# Patient Record
Sex: Female | Born: 1960 | Race: Black or African American | Hispanic: No | Marital: Single | Smoking: Never smoker
Health system: Southern US, Community
[De-identification: ages and names within clinical notes are randomized; demographics above are authoritative.]

## PROBLEM LIST (undated history)

## (undated) DIAGNOSIS — E559 Vitamin D deficiency, unspecified: Secondary | ICD-10-CM

## (undated) DIAGNOSIS — D649 Anemia, unspecified: Secondary | ICD-10-CM

## (undated) DIAGNOSIS — N946 Dysmenorrhea, unspecified: Secondary | ICD-10-CM

## (undated) DIAGNOSIS — R519 Headache, unspecified: Secondary | ICD-10-CM

## (undated) DIAGNOSIS — R51 Headache: Secondary | ICD-10-CM

## (undated) DIAGNOSIS — N83209 Unspecified ovarian cyst, unspecified side: Secondary | ICD-10-CM

## (undated) DIAGNOSIS — I10 Essential (primary) hypertension: Secondary | ICD-10-CM

## (undated) DIAGNOSIS — N841 Polyp of cervix uteri: Secondary | ICD-10-CM

## (undated) DIAGNOSIS — K589 Irritable bowel syndrome without diarrhea: Secondary | ICD-10-CM

## (undated) DIAGNOSIS — M1990 Unspecified osteoarthritis, unspecified site: Secondary | ICD-10-CM

## (undated) DIAGNOSIS — D219 Benign neoplasm of connective and other soft tissue, unspecified: Secondary | ICD-10-CM

## (undated) HISTORY — PX: COLONOSCOPY: SHX174

## (undated) HISTORY — DX: Unspecified ovarian cyst, unspecified side: N83.209

## (undated) HISTORY — DX: Vitamin D deficiency, unspecified: E55.9

## (undated) HISTORY — DX: Dysmenorrhea, unspecified: N94.6

## (undated) HISTORY — DX: Anemia, unspecified: D64.9

## (undated) HISTORY — DX: Benign neoplasm of connective and other soft tissue, unspecified: D21.9

## (undated) HISTORY — PX: BREAST BIOPSY: SHX20

## (undated) HISTORY — PX: BREAST EXCISIONAL BIOPSY: SUR124

## (undated) HISTORY — DX: Polyp of cervix uteri: N84.1

---

## 1997-12-14 ENCOUNTER — Ambulatory Visit (HOSPITAL_COMMUNITY): Admission: RE | Admit: 1997-12-14 | Discharge: 1997-12-14 | Payer: Self-pay | Admitting: Dermatology

## 1998-01-14 ENCOUNTER — Ambulatory Visit (HOSPITAL_COMMUNITY): Admission: RE | Admit: 1998-01-14 | Discharge: 1998-01-14 | Payer: Self-pay | Admitting: Obstetrics and Gynecology

## 1999-06-17 ENCOUNTER — Encounter: Admission: RE | Admit: 1999-06-17 | Discharge: 1999-06-17 | Payer: Self-pay | Admitting: Obstetrics & Gynecology

## 1999-06-17 ENCOUNTER — Encounter: Payer: Self-pay | Admitting: Obstetrics & Gynecology

## 1999-07-07 ENCOUNTER — Other Ambulatory Visit: Admission: RE | Admit: 1999-07-07 | Discharge: 1999-07-07 | Payer: Self-pay | Admitting: Obstetrics and Gynecology

## 2000-06-27 ENCOUNTER — Encounter: Admission: RE | Admit: 2000-06-27 | Discharge: 2000-06-27 | Payer: Self-pay | Admitting: Internal Medicine

## 2000-06-27 ENCOUNTER — Encounter: Payer: Self-pay | Admitting: Internal Medicine

## 2000-07-05 ENCOUNTER — Encounter: Payer: Self-pay | Admitting: Internal Medicine

## 2000-07-05 ENCOUNTER — Encounter: Admission: RE | Admit: 2000-07-05 | Discharge: 2000-07-05 | Payer: Self-pay | Admitting: Internal Medicine

## 2000-09-19 ENCOUNTER — Other Ambulatory Visit: Admission: RE | Admit: 2000-09-19 | Discharge: 2000-09-19 | Payer: Self-pay | Admitting: Obstetrics and Gynecology

## 2001-07-10 ENCOUNTER — Encounter: Admission: RE | Admit: 2001-07-10 | Discharge: 2001-07-10 | Payer: Self-pay | Admitting: Internal Medicine

## 2001-07-10 ENCOUNTER — Encounter: Payer: Self-pay | Admitting: Internal Medicine

## 2001-07-23 ENCOUNTER — Encounter: Payer: Self-pay | Admitting: Internal Medicine

## 2001-07-23 ENCOUNTER — Encounter: Admission: RE | Admit: 2001-07-23 | Discharge: 2001-07-23 | Payer: Self-pay | Admitting: Internal Medicine

## 2001-09-19 ENCOUNTER — Other Ambulatory Visit: Admission: RE | Admit: 2001-09-19 | Discharge: 2001-09-19 | Payer: Self-pay | Admitting: Obstetrics and Gynecology

## 2002-09-05 ENCOUNTER — Encounter: Admission: RE | Admit: 2002-09-05 | Discharge: 2002-09-05 | Payer: Self-pay | Admitting: Internal Medicine

## 2002-09-05 ENCOUNTER — Encounter: Payer: Self-pay | Admitting: Internal Medicine

## 2002-09-23 ENCOUNTER — Other Ambulatory Visit: Admission: RE | Admit: 2002-09-23 | Discharge: 2002-09-23 | Payer: Self-pay | Admitting: Obstetrics and Gynecology

## 2003-09-16 ENCOUNTER — Other Ambulatory Visit: Admission: RE | Admit: 2003-09-16 | Discharge: 2003-09-16 | Payer: Self-pay | Admitting: Obstetrics and Gynecology

## 2003-09-29 ENCOUNTER — Ambulatory Visit (HOSPITAL_COMMUNITY): Admission: RE | Admit: 2003-09-29 | Discharge: 2003-09-29 | Payer: Self-pay | Admitting: Obstetrics and Gynecology

## 2004-09-21 ENCOUNTER — Other Ambulatory Visit: Admission: RE | Admit: 2004-09-21 | Discharge: 2004-09-21 | Payer: Self-pay | Admitting: Obstetrics and Gynecology

## 2004-10-05 ENCOUNTER — Encounter: Admission: RE | Admit: 2004-10-05 | Discharge: 2004-10-05 | Payer: Self-pay | Admitting: Internal Medicine

## 2005-09-27 ENCOUNTER — Other Ambulatory Visit: Admission: RE | Admit: 2005-09-27 | Discharge: 2005-09-27 | Payer: Self-pay | Admitting: Obstetrics and Gynecology

## 2005-10-17 ENCOUNTER — Encounter: Admission: RE | Admit: 2005-10-17 | Discharge: 2005-10-17 | Payer: Self-pay | Admitting: Obstetrics and Gynecology

## 2005-10-24 ENCOUNTER — Encounter: Admission: RE | Admit: 2005-10-24 | Discharge: 2005-10-24 | Payer: Self-pay | Admitting: Obstetrics and Gynecology

## 2006-04-06 ENCOUNTER — Encounter: Admission: RE | Admit: 2006-04-06 | Discharge: 2006-04-06 | Payer: Self-pay | Admitting: Obstetrics and Gynecology

## 2006-10-19 ENCOUNTER — Encounter: Admission: RE | Admit: 2006-10-19 | Discharge: 2006-10-19 | Payer: Self-pay | Admitting: Obstetrics and Gynecology

## 2007-11-12 ENCOUNTER — Encounter: Admission: RE | Admit: 2007-11-12 | Discharge: 2007-11-12 | Payer: Self-pay | Admitting: Obstetrics and Gynecology

## 2008-12-10 ENCOUNTER — Encounter: Admission: RE | Admit: 2008-12-10 | Discharge: 2008-12-10 | Payer: Self-pay | Admitting: Obstetrics and Gynecology

## 2009-05-11 ENCOUNTER — Encounter: Admission: RE | Admit: 2009-05-11 | Discharge: 2009-05-11 | Payer: Self-pay | Admitting: Internal Medicine

## 2009-12-13 ENCOUNTER — Encounter: Admission: RE | Admit: 2009-12-13 | Discharge: 2009-12-13 | Payer: Self-pay | Admitting: Obstetrics and Gynecology

## 2010-04-25 ENCOUNTER — Encounter: Payer: Self-pay | Admitting: Obstetrics and Gynecology

## 2010-11-15 ENCOUNTER — Other Ambulatory Visit: Payer: Self-pay | Admitting: Obstetrics and Gynecology

## 2010-11-15 DIAGNOSIS — Z1231 Encounter for screening mammogram for malignant neoplasm of breast: Secondary | ICD-10-CM

## 2010-12-16 ENCOUNTER — Ambulatory Visit
Admission: RE | Admit: 2010-12-16 | Discharge: 2010-12-16 | Disposition: A | Discharge status: Home / Self Care | Payer: 59 | Source: Ambulatory Visit | Attending: Obstetrics and Gynecology | Admitting: Obstetrics and Gynecology

## 2010-12-16 DIAGNOSIS — Z1231 Encounter for screening mammogram for malignant neoplasm of breast: Secondary | ICD-10-CM

## 2011-07-21 ENCOUNTER — Telehealth: Payer: Self-pay

## 2011-07-21 NOTE — Telephone Encounter (Signed)
PC TO PT PER MESSAGE TO HAVE SOMEONE CALL HER. LM ON VM ORIGINALLY 07/14/11. PT STATES,"HAD SPOTTING X APPROX 2WEEKS AGO;LASTING X 1 WEEK. SPOTTING STOPPED ON 07/15/2011. PT TAKING PROVERA 30MG  DAILY.  TOLD PT TO CB IF BLDG REOCCURS. PT AGREES.

## 2011-11-13 ENCOUNTER — Other Ambulatory Visit: Payer: Self-pay | Admitting: Obstetrics and Gynecology

## 2011-11-13 DIAGNOSIS — Z1231 Encounter for screening mammogram for malignant neoplasm of breast: Secondary | ICD-10-CM

## 2011-12-18 ENCOUNTER — Ambulatory Visit: Payer: 59

## 2011-12-21 ENCOUNTER — Ambulatory Visit
Admission: RE | Admit: 2011-12-21 | Discharge: 2011-12-21 | Disposition: A | Discharge status: Home / Self Care | Payer: 59 | Source: Ambulatory Visit | Attending: Obstetrics and Gynecology | Admitting: Obstetrics and Gynecology

## 2011-12-21 DIAGNOSIS — Z1231 Encounter for screening mammogram for malignant neoplasm of breast: Secondary | ICD-10-CM

## 2011-12-27 ENCOUNTER — Encounter: Payer: Self-pay | Admitting: Obstetrics and Gynecology

## 2012-01-10 ENCOUNTER — Telehealth: Payer: Self-pay

## 2012-01-10 NOTE — Telephone Encounter (Signed)
Walk in pt on 01/09/12 per Dineta R. Pt wanted vph to know she is now taking Provera 40mg  vs Provera 30mg  due to bldg reoccurring x 2 days ago. Informed pt to cb if bldg last after 7 days of being on Provera 40mg . Pt voices understanding. AEX sched 01/18/12 with vph.

## 2012-01-18 ENCOUNTER — Ambulatory Visit (INDEPENDENT_AMBULATORY_CARE_PROVIDER_SITE_OTHER): Payer: 59 | Admitting: Obstetrics and Gynecology

## 2012-01-18 ENCOUNTER — Encounter: Payer: Self-pay | Admitting: Obstetrics and Gynecology

## 2012-01-18 VITALS — BP 122/62 | Ht 59.0 in | Wt 101.0 lb

## 2012-01-18 DIAGNOSIS — D219 Benign neoplasm of connective and other soft tissue, unspecified: Secondary | ICD-10-CM

## 2012-01-18 DIAGNOSIS — N924 Excessive bleeding in the premenopausal period: Secondary | ICD-10-CM

## 2012-01-18 DIAGNOSIS — Z124 Encounter for screening for malignant neoplasm of cervix: Secondary | ICD-10-CM

## 2012-01-18 DIAGNOSIS — D259 Leiomyoma of uterus, unspecified: Secondary | ICD-10-CM

## 2012-01-18 MED ORDER — MEDROXYPROGESTERONE ACETATE 10 MG PO TABS: 10.0000 mg | ORAL_TABLET | Freq: Three times a day (TID) | ORAL | Status: DC

## 2012-01-18 NOTE — Progress Notes (Signed)
ANNUAL:  Last Pap: 12/26/2010 WNL: Yes  No hx of abnl paps Regular Periods:no Contraception: Abstinence Monthly Breast exam:no Tetanus<62yrs:yes Nl.Bladder Function:yes Daily BMs:yes Healthy Diet:yes Calcium:yes Mammogram:yes Date of Mammogram: 12/27/2011 Exercise:yes Have often Exercise: 3 times weekly Seatbelt: yes Abuse at home: no Stressful work:yes Sigmoid-colonoscopy: 12/2011 Bone Density: Yes had one many years ago at a health fair PCP: Dr. Renae Gloss Change in PMH: None Change in [facility]: None  Subjective:    Rebekah Jones is a 51 y.o. female G0P0000 who presents for annual exam.  She is taking Provera for control of perimenopausal bleeding pattern.  Denies menopausal sx.   The following portions of the patient's history were reviewed and updated as appropriate: allergies, current medications, past family history, past medical history, past social history, past surgical history and problem list.  Review of Systems Pertinent items are noted in HPI. Gastrointestinal:No change in bowel habits, no abdominal pain, no rectal bleeding Genitourinary:negative for dysuria, frequency, hematuria, nocturia and urinary incontinence    Objective:     Ht 4\' 11"  (1.499 m)  Wt 101 lb (45.813 kg)  BMI 20.40 kg/m2  Weight:  Wt Readings from Last 1 Encounters:  01/18/12 101 lb (45.813 kg)     BMI: Body mass index is 20.40 kg/(m^2). General Appearance: Alert, appropriate appearance for age. No acute distress HEENT: Grossly normal Neck / Thyroid: Supple, no masses, nodes or enlargement Lungs: clear to auscultation bilaterally Back: No CVA tenderness Breast Exam: No masses or nodes.No dimpling, nipple retraction or discharge. Cardiovascular: Regular rate and rhythm. S1, S2, no murmur Gastrointestinal: Soft, non-tender, no masses or organomegaly Pelvic Exam: External genitalia: normal general appearance Vaginal: atrophic mucosa Cervix: assymetrial.  No lesions Adnexa: non  palpable Uterus: irregular enlargement and 14-16 weeks size Exam limited by anxiety Rectovaginal: normal rectal, no masses Lymphatic Exam: Non-palpable nodes in neck, clavicular, axillary, or inguinal regions Skin: no rash or abnormalities Neurologic: Normal gait and speech, no tremor  Psychiatric: Alert and oriented, appropriate affect.    Urinalysis:Not done    Assessment:  Asymptomatic fibroids  Perimenopausal bleeding pattern with nl endo bx 1 yr ago  Improved bleeding pattern on  Provera 30 mg daily Plan:  Continue Provera 30 mg daily mammogram pap smear with HR HPV Follow-up:  in 6 month(s)   Dierdre Forth MD

## 2012-01-23 LAB — PAP IG AND HPV HIGH-RISK: HPV DNA High Risk: NOT DETECTED

## 2012-11-13 ENCOUNTER — Other Ambulatory Visit: Payer: Self-pay

## 2012-11-13 DIAGNOSIS — Z1231 Encounter for screening mammogram for malignant neoplasm of breast: Secondary | ICD-10-CM

## 2012-11-15 ENCOUNTER — Other Ambulatory Visit: Payer: Self-pay | Admitting: Obstetrics and Gynecology

## 2012-11-15 DIAGNOSIS — N950 Postmenopausal bleeding: Secondary | ICD-10-CM

## 2012-11-25 ENCOUNTER — Ambulatory Visit (HOSPITAL_COMMUNITY): Admission: RE | Admit: 2012-11-25 | Payer: 59 | Source: Ambulatory Visit

## 2012-11-25 ENCOUNTER — Other Ambulatory Visit: Payer: 59

## 2012-12-23 ENCOUNTER — Ambulatory Visit
Admission: RE | Admit: 2012-12-23 | Discharge: 2012-12-23 | Disposition: A | Discharge status: Home / Self Care | Payer: 59 | Source: Ambulatory Visit

## 2012-12-23 DIAGNOSIS — Z1231 Encounter for screening mammogram for malignant neoplasm of breast: Secondary | ICD-10-CM

## 2013-01-22 ENCOUNTER — Other Ambulatory Visit: Payer: Self-pay | Admitting: Obstetrics and Gynecology

## 2013-01-22 DIAGNOSIS — N924 Excessive bleeding in the premenopausal period: Secondary | ICD-10-CM

## 2013-01-28 ENCOUNTER — Ambulatory Visit (HOSPITAL_COMMUNITY)
Admission: RE | Admit: 2013-01-28 | Discharge: 2013-01-28 | Disposition: A | Discharge status: Home / Self Care | Payer: 59 | Source: Ambulatory Visit | Attending: Obstetrics and Gynecology | Admitting: Obstetrics and Gynecology

## 2013-01-28 ENCOUNTER — Other Ambulatory Visit: Payer: Self-pay | Admitting: Obstetrics and Gynecology

## 2013-01-28 DIAGNOSIS — D259 Leiomyoma of uterus, unspecified: Secondary | ICD-10-CM | POA: Insufficient documentation

## 2013-01-28 DIAGNOSIS — N924 Excessive bleeding in the premenopausal period: Secondary | ICD-10-CM

## 2013-01-28 MED ORDER — GADOBENATE DIMEGLUMINE 529 MG/ML IV SOLN
8.0000 mL | Freq: Once | INTRAVENOUS | Status: AC | PRN
  Administered 2013-01-28: 8 mL via INTRAVENOUS

## 2013-03-21 ENCOUNTER — Other Ambulatory Visit: Payer: Self-pay | Admitting: Obstetrics and Gynecology

## 2013-04-01 ENCOUNTER — Encounter (HOSPITAL_COMMUNITY): Payer: Self-pay | Admitting: Pharmacist

## 2013-04-10 ENCOUNTER — Other Ambulatory Visit: Payer: Self-pay

## 2013-04-10 ENCOUNTER — Encounter (INDEPENDENT_AMBULATORY_CARE_PROVIDER_SITE_OTHER): Payer: Self-pay

## 2013-04-10 ENCOUNTER — Encounter (HOSPITAL_COMMUNITY): Payer: Self-pay

## 2013-04-10 ENCOUNTER — Encounter (HOSPITAL_COMMUNITY)
Admission: RE | Admit: 2013-04-10 | Discharge: 2013-04-10 | Disposition: A | Discharge status: Home / Self Care | Payer: Managed Care, Other (non HMO) | Source: Ambulatory Visit | Attending: Obstetrics and Gynecology | Admitting: Obstetrics and Gynecology

## 2013-04-10 HISTORY — DX: Essential (primary) hypertension: I10

## 2013-04-10 LAB — CBC
HEMATOCRIT: 39.2 % (ref 36.0–46.0)
HEMOGLOBIN: 13.1 g/dL (ref 12.0–15.0)
MCH: 29 pg (ref 26.0–34.0)
MCHC: 33.4 g/dL (ref 30.0–36.0)
MCV: 86.7 fL (ref 78.0–100.0)
Platelets: 200 10*3/uL (ref 150–400)
RBC: 4.52 MIL/uL (ref 3.87–5.11)
RDW: 12.4 % (ref 11.5–15.5)
WBC: 7.1 10*3/uL (ref 4.0–10.5)

## 2013-04-10 LAB — BASIC METABOLIC PANEL
BUN: 13 mg/dL (ref 6–23)
CHLORIDE: 99 meq/L (ref 96–112)
CO2: 27 meq/L (ref 19–32)
CREATININE: 0.73 mg/dL (ref 0.50–1.10)
Calcium: 9.9 mg/dL (ref 8.4–10.5)
GFR calc Af Amer: >90 mL/min (ref >90)
GFR calc non Af Amer: >90 mL/min (ref >90)
GLUCOSE: 82 mg/dL (ref 70–99)
Potassium: 3.5 mEq/L — ABNORMAL LOW (ref 3.7–5.3)
Sodium: 136 mEq/L — ABNORMAL LOW (ref 137–147)

## 2013-04-10 NOTE — Patient Instructions (Signed)
[place]  04/10/2013   Your procedure is scheduled on:  04/11/13  Enter through the Main Entrance of [facility] up the phone at the desk and dial [phone].   Call this number if you have problems the morning of surgery: [phone]   Remember:   Do not eat food:After Midnight.  Do not drink clear liquids: After Midnight.  Take these medicines the morning of surgery with A SIP OF WATER: Maxide   Do not wear jewelry, make-up or nail polish.  Do not wear lotions, powders, or perfumes. You may wear deodorant.  Do not shave 48 hours prior to surgery.  Do not bring valuables to the hospital.  [facility] is not   responsible for any belongings or valuables brought to the hospital.  Contacts, dentures or bridgework may not be worn into surgery.  Leave suitcase in the car. After surgery it may be brought to your room.  For patients admitted to the hospital, checkout time is 11:00 AM the day of              discharge.   Patients discharged the day of surgery will not be allowed to drive             home.  Name and phone number of your driver: Education administrator  Special Instructions:   Shower using CHG 2 nights before surgery and the night before surgery.  If you shower the day of surgery use CHG.  Use special wash - you have one bottle of CHG for all showers.  You should use approximately 1/3 of the bottle for each shower.   Please read over the following fact sheets that you were given:   Surgical Site Infection Prevention

## 2013-04-11 ENCOUNTER — Encounter (HOSPITAL_COMMUNITY)
Admission: RE | Disposition: A | Discharge status: Home / Self Care | Payer: Self-pay | Source: Ambulatory Visit | Attending: Obstetrics and Gynecology

## 2013-04-11 ENCOUNTER — Encounter (HOSPITAL_COMMUNITY): Payer: Managed Care, Other (non HMO) | Admitting: Anesthesiology

## 2013-04-11 ENCOUNTER — Ambulatory Visit (HOSPITAL_COMMUNITY)
Admission: RE | Admit: 2013-04-11 | Discharge: 2013-04-11 | Disposition: A | Discharge status: Home / Self Care | Payer: Managed Care, Other (non HMO) | Source: Ambulatory Visit | Attending: Obstetrics and Gynecology | Admitting: Obstetrics and Gynecology

## 2013-04-11 ENCOUNTER — Encounter (HOSPITAL_COMMUNITY): Payer: Self-pay | Admitting: Obstetrics and Gynecology

## 2013-04-11 ENCOUNTER — Ambulatory Visit (HOSPITAL_COMMUNITY): Payer: Managed Care, Other (non HMO) | Admitting: Anesthesiology

## 2013-04-11 DIAGNOSIS — N950 Postmenopausal bleeding: Secondary | ICD-10-CM | POA: Diagnosis present

## 2013-04-11 DIAGNOSIS — D251 Intramural leiomyoma of uterus: Secondary | ICD-10-CM | POA: Insufficient documentation

## 2013-04-11 HISTORY — PX: DILATATION & CURETTAGE/HYSTEROSCOPY WITH TRUECLEAR: SHX6353

## 2013-04-11 LAB — PREGNANCY, URINE: Preg Test, Ur: NEGATIVE

## 2013-04-11 SURGERY — DILATATION & CURETTAGE/HYSTEROSCOPY WITH TRUCLEAR
Anesthesia: General | Site: Vagina

## 2013-04-11 MED ORDER — FENTANYL CITRATE 0.05 MG/ML IJ SOLN
INTRAMUSCULAR | Status: AC
  Filled 2013-04-11: qty 2

## 2013-04-11 MED ORDER — KETOROLAC TROMETHAMINE 30 MG/ML IJ SOLN
INTRAMUSCULAR | Status: DC | PRN
  Administered 2013-04-11: 15 mg via INTRAVENOUS

## 2013-04-11 MED ORDER — ONDANSETRON HCL 4 MG/2ML IJ SOLN
INTRAMUSCULAR | Status: AC
  Filled 2013-04-11: qty 2

## 2013-04-11 MED ORDER — FENTANYL CITRATE 0.05 MG/ML IJ SOLN
25.0000 ug | INTRAMUSCULAR | Status: DC | PRN
Start: 2013-04-11 — End: 2013-04-11

## 2013-04-11 MED ORDER — PROMETHAZINE HCL 25 MG/ML IJ SOLN
6.2500 mg | INTRAMUSCULAR | Status: DC | PRN
  Administered 2013-04-11: 12.5 mg via INTRAVENOUS

## 2013-04-11 MED ORDER — DEXAMETHASONE SODIUM PHOSPHATE 10 MG/ML IJ SOLN
INTRAMUSCULAR | Status: DC | PRN
  Administered 2013-04-11: 10 mg via INTRAVENOUS

## 2013-04-11 MED ORDER — PROPOFOL 10 MG/ML IV BOLUS
INTRAVENOUS | Status: DC | PRN
  Administered 2013-04-11: 100 mg via INTRAVENOUS

## 2013-04-11 MED ORDER — MIDAZOLAM HCL 2 MG/2ML IJ SOLN
INTRAMUSCULAR | Status: AC
  Filled 2013-04-11: qty 2

## 2013-04-11 MED ORDER — SODIUM CHLORIDE 0.9 % IJ SOLN
INTRAMUSCULAR | Status: AC
  Filled 2013-04-11: qty 50

## 2013-04-11 MED ORDER — ONDANSETRON HCL 4 MG/2ML IJ SOLN
4.0000 mg | Freq: Once | INTRAMUSCULAR | Status: AC | PRN
  Administered 2013-04-11: 4 mg via INTRAVENOUS

## 2013-04-11 MED ORDER — PROPOFOL 10 MG/ML IV EMUL
INTRAVENOUS | Status: AC
  Filled 2013-04-11: qty 20

## 2013-04-11 MED ORDER — VASOPRESSIN 20 UNIT/ML IJ SOLN
INTRAMUSCULAR | Status: AC
  Filled 2013-04-11: qty 1

## 2013-04-11 MED ORDER — ONDANSETRON HCL 4 MG/2ML IJ SOLN
INTRAMUSCULAR | Status: DC | PRN
  Administered 2013-04-11: 4 mg via INTRAVENOUS

## 2013-04-11 MED ORDER — KETOROLAC TROMETHAMINE 60 MG/2ML IM SOLN
INTRAMUSCULAR | Status: DC | PRN
  Administered 2013-04-11: 15 mg via INTRAMUSCULAR

## 2013-04-11 MED ORDER — LIDOCAINE HCL 2 % IJ SOLN
INTRAMUSCULAR | Status: DC | PRN
  Administered 2013-04-11: 10 mL

## 2013-04-11 MED ORDER — FENTANYL CITRATE 0.05 MG/ML IJ SOLN
INTRAMUSCULAR | Status: DC | PRN
  Administered 2013-04-11 (×4): 50 ug via INTRAVENOUS

## 2013-04-11 MED ORDER — EPHEDRINE 5 MG/ML INJ
INTRAVENOUS | Status: AC
  Filled 2013-04-11: qty 10

## 2013-04-11 MED ORDER — SODIUM CHLORIDE 0.9 % IR SOLN
Status: DC | PRN
  Administered 2013-04-11: 3000 mL

## 2013-04-11 MED ORDER — ONDANSETRON HCL 4 MG/2ML IJ SOLN
INTRAMUSCULAR | Status: AC
  Administered 2013-04-11: 16:00:00 4 mg via INTRAVENOUS
  Filled 2013-04-11: qty 2

## 2013-04-11 MED ORDER — MIDAZOLAM HCL 5 MG/5ML IJ SOLN
INTRAMUSCULAR | Status: DC | PRN
  Administered 2013-04-11: 2 mg via INTRAVENOUS

## 2013-04-11 MED ORDER — KETOROLAC TROMETHAMINE 30 MG/ML IJ SOLN: 15.0000 mg | Freq: Once | INTRAMUSCULAR | Status: DC | PRN

## 2013-04-11 MED ORDER — GENTAMICIN SULFATE 40 MG/ML IJ SOLN
Freq: Once | INTRAMUSCULAR | Status: AC
  Administered 2013-04-11: 100 mL via INTRAVENOUS
  Filled 2013-04-11: qty 1.5

## 2013-04-11 MED ORDER — EPHEDRINE SULFATE 50 MG/ML IJ SOLN
INTRAMUSCULAR | Status: DC | PRN
  Administered 2013-04-11 (×2): 10 mg via INTRAVENOUS
  Administered 2013-04-11 (×2): 5 mg via INTRAVENOUS

## 2013-04-11 MED ORDER — DEXAMETHASONE SODIUM PHOSPHATE 10 MG/ML IJ SOLN
INTRAMUSCULAR | Status: AC
  Filled 2013-04-11: qty 1

## 2013-04-11 MED ORDER — LIDOCAINE HCL (CARDIAC) 20 MG/ML IV SOLN
INTRAVENOUS | Status: DC | PRN
  Administered 2013-04-11: 40 mg via INTRAVENOUS

## 2013-04-11 MED ORDER — SODIUM CHLORIDE 0.9 % IJ SOLN
INTRAMUSCULAR | Status: AC
  Filled 2013-04-11: qty 20

## 2013-04-11 MED ORDER — KETOROLAC TROMETHAMINE 30 MG/ML IJ SOLN
INTRAMUSCULAR | Status: AC
  Filled 2013-04-11: qty 1

## 2013-04-11 MED ORDER — LIDOCAINE HCL (CARDIAC) 20 MG/ML IV SOLN
INTRAVENOUS | Status: AC
  Filled 2013-04-11: qty 5

## 2013-04-11 MED ORDER — LIDOCAINE HCL 2 % IJ SOLN
INTRAMUSCULAR | Status: AC
  Filled 2013-04-11: qty 20

## 2013-04-11 MED ORDER — PROMETHAZINE HCL 25 MG/ML IJ SOLN
INTRAMUSCULAR | Status: AC
  Administered 2013-04-11: 16:00:00 12.5 mg via INTRAVENOUS
  Filled 2013-04-11: qty 1

## 2013-04-11 MED ORDER — MEPERIDINE HCL 25 MG/ML IJ SOLN: 6.2500 mg | INTRAMUSCULAR | Status: DC | PRN

## 2013-04-11 MED ORDER — LACTATED RINGERS IV SOLN
INTRAVENOUS | Status: DC
  Administered 2013-04-11 (×2): via INTRAVENOUS

## 2013-04-11 SURGICAL SUPPLY — 28 items
BLADE INCISOR TRUC PLUS 2.9 (ABLATOR) IMPLANT
BOOTIES KNEE HIGH SLOAN (MISCELLANEOUS) IMPLANT
CANISTERS HI-FLOW 3000CC (CANNISTER) ×3 IMPLANT
CATH ROBINSON RED A/P 16FR (CATHETERS) IMPLANT
CLOTH BEACON ORANGE TIMEOUT ST (SAFETY) ×3 IMPLANT
CONTAINER PREFILL 10% NBF 60ML (FORM) ×3 IMPLANT
CORD ACTIVE DISPOSABLE (ELECTRODE) ×2
CORD ELECTRO ACTIVE DISP (ELECTRODE) ×1 IMPLANT
DILATOR CANAL MILEX (MISCELLANEOUS) IMPLANT
DRAPE HYSTEROSCOPY (DRAPE) ×3 IMPLANT
DRSG TELFA 3X8 NADH (GAUZE/BANDAGES/DRESSINGS) ×3 IMPLANT
ELECT REM PT RETURN 9FT ADLT (ELECTROSURGICAL) ×3
ELECTRODE REM PT RTRN 9FT ADLT (ELECTROSURGICAL) ×1 IMPLANT
GLOVE SURG SS PI 6.5 STRL IVOR (GLOVE) ×6 IMPLANT
GLYCINE 1.5% IRRIG UROMATIC (IV SOLUTION) ×6 IMPLANT
GOWN STRL REIN XL XLG (GOWN DISPOSABLE) ×6 IMPLANT
INCISOR TRUC PLUS BLADE 2.9 (ABLATOR)
IV NS IRRIG 3000ML ARTHROMATIC (IV SOLUTION) ×12 IMPLANT
KIT HYSTEROSCOPY TRUCLEAR (ABLATOR) ×3 IMPLANT
LOOP ANGLED CUTTING 22FR (CUTTING LOOP) ×3 IMPLANT
MORCELLATOR RECIP TRUCLEAR 4.0 (ABLATOR) ×3 IMPLANT
NEEDLE SPNL 22GX3.5 QUINCKE BK (NEEDLE) ×3 IMPLANT
PACK VAGINAL MINOR WOMEN LF (CUSTOM PROCEDURE TRAY) ×3 IMPLANT
PAD OB MATERNITY 4.3X12.25 (PERSONAL CARE ITEMS) ×3 IMPLANT
SYR CONTROL 10ML LL (SYRINGE) ×3 IMPLANT
TOWEL OR 17X24 6PK STRL BLUE (TOWEL DISPOSABLE) ×6 IMPLANT
TRAY FOLEY CATH 14FR (SET/KITS/TRAYS/PACK) ×3 IMPLANT
WATER STERILE IRR 1000ML POUR (IV SOLUTION) ×3 IMPLANT

## 2013-04-11 NOTE — H&P (Signed)
Rebekah Jones is an 53 y.o. female. Presents for evaluation of menopausal bleeding in the presence of known fibroids which had been managed in the past with oral Provera, but in the last year has occurred in spite of provera use.  Her las ultrasound in 2011 suggested a submucosal fibroid measuring about 4 cm.  MRI DONE IN 01/2013 showed a 5 cm intracavitary fibroid as well as diffuse involvement of the uterus with intramural fibroids.  The ovaries appeared nl.  Pt presents for resection of the intracavitary fibroid and global sampling of the endometrium.  Pertinent Gynecological History: Menses: post-menopausal Bleeding: post menopausal bleeding Contraception: abstinence DES exposure: denies Blood transfusions: none Sexually transmitted diseases: no past history Previous GYN Procedures: endometrial bx 2012= nl  Last mammogram: normal Date: 2014 Last pap: normal Date: 2013 OB History: G0,   Menstrual History: Menarche age: 27 Patient's last menstrual period was 12/16/2010.    Past Medical History  Diagnosis Date  . Dysmenorrhea   . Fibroids   . Endocervical polyp   . Anemia   . Vitamin D deficiency   . Ovarian cyst   . Hypertension     Past Surgical History  Procedure Laterality Date  . Breast biopsy      Family History  Problem Relation Age of Onset  . Diabetes Maternal Grandmother   . Cancer Maternal Grandmother   . Hypertension Mother   . Diabetes Father   . Hypertension Father   . Alzheimer's disease Father   . Hypertension Sister   . Alzheimer's disease Paternal Aunt   . Cancer Paternal Aunt     Social History:  reports that she has never smoked. She has never used smokeless tobacco. She reports that she does not drink alcohol or use illicit drugs.  Allergies:  Allergies  Allergen Reactions  . Eggs Or Egg-Derived Products   . Penicillins Hives  . Sulfa Antibiotics Hives    Prescriptions prior to admission  Medication Sig Dispense Refill  . ferrous  sulfate 325 (65 FE) MG tablet Take 325 mg by mouth daily with breakfast.      . loratadine (CLARITIN) 10 MG tablet Take 10 mg by mouth daily.      . medroxyPROGESTERone (PROVERA) 10 MG tablet Take 30 mg by mouth at bedtime.      . triamterene-hydrochlorothiazide (MAXZIDE-25) 37.5-25 MG per tablet Take 1 tablet by mouth daily.      . valsartan (DIOVAN) 40 MG tablet Take 40 mg by mouth daily.        Review of Systems  Constitutional: Negative.   HENT: Negative.   Eyes: Negative.   Respiratory: Negative.   Cardiovascular: Negative.   Gastrointestinal: Negative.   Genitourinary: Negative.   Musculoskeletal: Negative.   Skin: Negative.   Neurological: Negative.   Endo/Heme/Allergies: Negative.     Blood pressure 124/62, pulse 67, temperature 97.9 F (36.6 C), temperature source Oral, resp. rate 16, last menstrual period 12/16/2010, SpO2 100.00%. Physical Exam  Constitutional: She is oriented to person, place, and time. She appears well-developed and well-nourished.  HENT:  Head: Normocephalic and atraumatic.  Eyes: Conjunctivae and EOM are normal.  Neck: Normal range of motion. Neck supple. No thyromegaly present.  Cardiovascular: Normal rate and regular rhythm.   Respiratory: Effort normal and breath sounds normal.  GI: Soft. Bowel sounds are normal. She exhibits mass.  Suprapubic mass nontender consistent with fibroid uterus  Genitourinary: Vagina normal.  Musculoskeletal: Normal range of motion.  Neurological: She is alert and oriented  to person, place, and time.  Skin: Skin is warm and dry.  Psychiatric: She has a normal mood and affect. Her behavior is normal.    Results for orders placed during the hospital encounter of 04/11/13 (from the past 24 hour(s))  PREGNANCY, URINE     Status: None   Collection Time    04/11/13  9:20 AM      Result Value Range   Preg Test, Ur NEGATIVE  NEGATIVE    No results found.  Assessment/Plan:   Recurrent episodes of irregular  perimenopausal bleeding. Long history of fibroids Nulliparous risk of endometrial disorder Poor tolerance for pelvic exams   Options for management reviewed with hysterectomy being the only option that is guaranteed to resolve bleeding. Pt declined this definitive therapy. Reviewed possible hysteroscopic removal of the intracavitary fibroid and sampling of endometrium. She consents to this procedure with the understanding that removal of a 5 cm fibroid may take more than one procedure and that resolution of bleeding cannot be guaranteed.   The risks of anesthesia, bleeding, infection, and damage to adjacent organs were reviewed.  The particular risk of uterine perforartion was reviewed with the possible need to terminate the procedure without completion.  She voices understanding and wishes to proceed.  Demetreus Lothamer P 04/11/2013, 9:52 AM

## 2013-04-11 NOTE — Discharge Instructions (Signed)
DISCHARGE INSTRUCTIONS: D&C /  The following instructions have been prepared to help you care for yourself upon your return home.  MAY TAKE IBUPROFEN (MOTRIN, ADVIL) OR ALEVE UNTIL 7:05 PM!!!   Personal hygiene:  Use sanitary pads for vaginal drainage, not tampons, MAY NOTICE SOME FIBROID PARTICLES.  DON'T BE ALARMED.  Shower the day after your procedure.  NO tub baths, pools or Jacuzzis for 2-3 weeks.  Wipe front to back after using the bathroom.  Activity and limitations:  Do NOT drive or operate any equipment for 24 hours. The effects of anesthesia are still present and drowsiness may result.  Do NOT rest in bed all day.  Walking is encouraged.  Walk up and down stairs slowly.  You may resume your normal activity in one to two days or as indicated by your physician.  Sexual activity: NO intercourse for at least 2 weeks after the procedure, or as indicated by your physician.  Diet: Eat a light meal as desired this evening. You may resume your usual diet tomorrow.  Return to work: You may resume your work activities in one to two days or as indicated by your doctor.  What to expect after your surgery: Expect to have vaginal bleeding/discharge for 2-3 days and spotting for up to 10 days. It is not unusual to have soreness for up to 1-2 weeks. You may have a slight burning sensation when you urinate for the first day. Mild cramps may continue for a couple of days. You may have a regular period in 2-6 weeks.  Call your doctor for any of the following:  Excessive vaginal bleeding, saturating and changing one pad every hour.  Inability to urinate 6 hours after discharge from hospital.  Pain not relieved by pain medication.  Fever of 100.4 F or greater.  Unusual vaginal discharge or odor.   Call for an appointment:    Patients signature: ______________________  Nurses signature ________________________  Support person's signature_______________________

## 2013-04-11 NOTE — Anesthesia Postprocedure Evaluation (Signed)
  Anesthesia Post Note  Patient: Rebekah Jones  Procedure(s) Performed: Procedure(s) (LRB): DILATATION & CURETTAGE/HYSTEROSCOPY WITH POSSIBLE TRUCLEAR (N/A)  Anesthesia type: GA  Patient location: PACU  Post pain: Pain level controlled  Post assessment: Post-op Vital signs reviewed  Last Vitals:  Filed Vitals:   04/11/13 1405  BP: 104/56  Pulse: 80  Temp: 35.8 C  Resp: 28    Post vital signs: Reviewed  Level of consciousness: sedated  Complications: No apparent anesthesia complications

## 2013-04-11 NOTE — Anesthesia Preprocedure Evaluation (Signed)
Anesthesia Evaluation  Patient identified by MRN, date of birth, ID band Patient awake    Reviewed: Allergy & Precautions, H&P , NPO status , Patient's Chart, lab work & pertinent test results  Airway Mallampati: I TM Distance: >3 FB Neck ROM: full    Dental no notable dental hx. (+) Teeth Intact   Pulmonary neg pulmonary ROS,          Cardiovascular hypertension, Pt. on medications     Neuro/Psych negative neurological ROS  negative psych ROS   GI/Hepatic negative GI ROS, Neg liver ROS,   Endo/Other  negative endocrine ROS  Renal/GU negative Renal ROS     Musculoskeletal   Abdominal Normal abdominal exam  (+)   Peds  Hematology negative hematology ROS (+)   Anesthesia Other Findings   Reproductive/Obstetrics negative OB ROS                           Anesthesia Physical Anesthesia Plan  ASA: II  Anesthesia Plan: General   Post-op Pain Management:    Induction: Intravenous  Airway Management Planned: LMA  Additional Equipment:   Intra-op Plan:   Post-operative Plan:   Informed Consent: I have reviewed the patients History and Physical, chart, labs and discussed the procedure including the risks, benefits and alternatives for the proposed anesthesia with the patient or authorized representative who has indicated his/her understanding and acceptance.     Plan Discussed with: CRNA and Surgeon  Anesthesia Plan Comments:         Anesthesia Quick Evaluation

## 2013-04-11 NOTE — Transfer of Care (Signed)
Immediate Anesthesia Transfer of Care Note  Patient: Rebekah Jones  Procedure(s) Performed: Procedure(s): DILATATION & CURETTAGE/HYSTEROSCOPY WITH POSSIBLE TRUCLEAR (N/A)  Patient Location: PACU  Anesthesia Type:General  Level of Consciousness: sedated  Airway & Oxygen Therapy: Patient Spontanous Breathing and Patient connected to nasal cannula oxygen  Post-op Assessment: Report given to PACU RN and Post -op Vital signs reviewed and stable  Post vital signs: stable  Complications: No apparent anesthesia complications

## 2013-04-12 MED ORDER — IBUPROFEN 600 MG PO TABS: ORAL_TABLET | ORAL | Status: DC

## 2013-04-12 NOTE — Op Note (Signed)
04/11/2013  8:33 AM  PATIENT:  Rebekah Jones  53 y.o. female  PRE-OPERATIVE DIAGNOSIS:  Uterine Fibroid .  Post menopausal bleeding  POST-OPERATIVE DIAGNOSIS:  Uterine Fibroid.  Post menopausal leeding  PROCEDURE:  Procedure(s): DILATATION & CURETTAGE/HYSTEROSCOPY WITH RESECTION OF FIBROID  SURGEON:  Roneshia Drew P, MD  ASSISTANTS: None  ANESTHESIA:   general  ESTIMATED BLOOD LOSS: * No blood loss amount entered *   BLOOD ADMINISTERED:none  COMPLICATIONS:  Inability to completely extract all fibroid pieces  FINDINGS: 5 cm intracavitary fibroid on a broad stalk on the right posterior endometrial cavity.  Remainder of endometrial cavity is atrophic.  The uterus sounded to 12 cm and was 14 wks size and irregular on bimanual examination under anesthesia.  FLUID DEFICIT:  1200cc normal saline.  350 cc glycine.  LOCAL MEDICATIONS USED:  LIDOCAINE  and Amount: 10 ml  SPECIMEN:  Source of Specimen:  Pieces of myoma and endometrial currettings  DISPOSITION OF SPECIMEN:  PATHOLOGY  COUNTS:  YES  DESCRIPTION OF PROCEDURE:the patient was taken to the operating room after appropriate identification and placed on the operating table. After the attainment of adequate general anesthesia she was placed in the lithotomy position. The perineum and vagina were prepped with multiple layers of Betadine.  A foley catheter was placed and connected to strait drainage. The perineum was draped in sterile field. A gray speculum was placed in the vagina. The cervix was grasped with a single-tooth tenaculum. A paracervical block was achieved with a total of 10 cc of 2% Xylocaine and the 5 and 7:00 positions. The uterus was sounded to 12 cm  The cervix was then dilated to accommodate the operative TruClear hysteroscope. The hysteroscope was used to evaluate all quadrants of the uterus with the above noted findings. Morcellation of the myoma was undertaken with the TruClear morcellation system until the  fibroid was detatched from its base and free floating in the endometrial cavity.  Further attempts to reduce the size of the myoma for extraction were limited by the inability to stabilize the detached fibroid within the cavity for effective morcellator placement. The normal saline fluid deficit at this time was 1200 cc. The hysteroscope was removed and the cavity curretted in all quadrants with a small amount of endometrial tissue obtained.  The [facility] hysteroscope was then placed in the endometrial cavity and the cavity rinsed with 300cc of glycine.  The resection electrode was used to resect pieces from the large fibroid and those were removed from the cavity.  Further resection was again limited by continued movement of the detached fibroid that remained and the hysteroscope removed.  The myoma extraction forceps were used to remove chunks of the resected myoma.  At a fluid deficit of 350 cc glycine the decision was made to discontinue the procedure with the expectation that the remainder of the fibroid could be passed transcervically spontaeously. All instruments were then removed from the vagina and the patient was awakened from general anesthesia and taken to the recovery room in satisfactory condition having tolerated the procedure well sponge and instrument counts correct.  PLAN OF CARE: discharge home after PACU guidelines are met                              Pt instructed to expect passage of small portions of myoma vaginally  PATIENT DISPOSITION:  PACU - hemodynamically stable.   Delay start of Pharmacological VTE agent (>24hrs) due to  surgical blood loss or risk of bleeding:  Yes.  SCD hose used during procedure.  Single dose of Gentamycin/Clindamycin given during the procedure   Eldred Manges, MD 8:33 AM

## 2013-04-14 ENCOUNTER — Encounter (HOSPITAL_COMMUNITY): Payer: Self-pay | Admitting: Obstetrics and Gynecology

## 2013-11-28 ENCOUNTER — Other Ambulatory Visit: Payer: Self-pay

## 2013-11-28 DIAGNOSIS — Z1231 Encounter for screening mammogram for malignant neoplasm of breast: Secondary | ICD-10-CM

## 2013-12-25 ENCOUNTER — Ambulatory Visit: Payer: Managed Care, Other (non HMO)

## 2013-12-31 ENCOUNTER — Ambulatory Visit
Admission: RE | Admit: 2013-12-31 | Discharge: 2013-12-31 | Disposition: A | Discharge status: Home / Self Care | Payer: Managed Care, Other (non HMO) | Source: Ambulatory Visit

## 2013-12-31 DIAGNOSIS — Z1231 Encounter for screening mammogram for malignant neoplasm of breast: Secondary | ICD-10-CM

## 2014-12-11 ENCOUNTER — Other Ambulatory Visit: Payer: Self-pay

## 2014-12-11 DIAGNOSIS — Z1231 Encounter for screening mammogram for malignant neoplasm of breast: Secondary | ICD-10-CM

## 2015-01-11 ENCOUNTER — Ambulatory Visit
Admission: RE | Admit: 2015-01-11 | Discharge: 2015-01-11 | Disposition: A | Discharge status: Home / Self Care | Payer: Managed Care, Other (non HMO) | Source: Ambulatory Visit

## 2015-01-11 DIAGNOSIS — Z1231 Encounter for screening mammogram for malignant neoplasm of breast: Secondary | ICD-10-CM

## 2015-10-20 ENCOUNTER — Other Ambulatory Visit: Payer: Self-pay | Admitting: Obstetrics and Gynecology

## 2015-11-18 ENCOUNTER — Other Ambulatory Visit: Payer: Self-pay

## 2015-11-18 ENCOUNTER — Encounter (HOSPITAL_COMMUNITY): Payer: Self-pay

## 2015-11-18 ENCOUNTER — Encounter (HOSPITAL_COMMUNITY)
Admission: RE | Admit: 2015-11-18 | Discharge: 2015-11-18 | Disposition: A | Discharge status: Home / Self Care | Payer: Managed Care, Other (non HMO) | Source: Ambulatory Visit | Attending: Obstetrics and Gynecology | Admitting: Obstetrics and Gynecology

## 2015-11-18 DIAGNOSIS — Z01810 Encounter for preprocedural cardiovascular examination: Secondary | ICD-10-CM | POA: Insufficient documentation

## 2015-11-18 DIAGNOSIS — Z01818 Encounter for other preprocedural examination: Secondary | ICD-10-CM | POA: Diagnosis present

## 2015-11-18 DIAGNOSIS — R9431 Abnormal electrocardiogram [ECG] [EKG]: Secondary | ICD-10-CM | POA: Insufficient documentation

## 2015-11-18 HISTORY — DX: Unspecified osteoarthritis, unspecified site: M19.90

## 2015-11-18 HISTORY — DX: Irritable bowel syndrome, unspecified: K58.9

## 2015-11-18 HISTORY — DX: Headache: R51

## 2015-11-18 HISTORY — DX: Headache, unspecified: R51.9

## 2015-11-18 LAB — CBC
HEMATOCRIT: 38 % (ref 36.0–46.0)
Hemoglobin: 12.6 g/dL (ref 12.0–15.0)
MCH: 29 pg (ref 26.0–34.0)
MCHC: 33.2 g/dL (ref 30.0–36.0)
MCV: 87.4 fL (ref 78.0–100.0)
Platelets: 201 10*3/uL (ref 150–400)
RBC: 4.35 MIL/uL (ref 3.87–5.11)
RDW: 12.8 % (ref 11.5–15.5)
WBC: 5.7 10*3/uL (ref 4.0–10.5)

## 2015-11-18 LAB — BASIC METABOLIC PANEL
ANION GAP: 8 (ref 5–15)
BUN: 15 mg/dL (ref 6–20)
CO2: 27 mmol/L (ref 22–32)
Calcium: 9.5 mg/dL (ref 8.9–10.3)
Chloride: 102 mmol/L (ref 101–111)
Creatinine, Ser: 0.7 mg/dL (ref 0.44–1.00)
GFR calc Af Amer: >60 mL/min (ref >60)
GFR calc non Af Amer: >60 mL/min (ref >60)
GLUCOSE: 87 mg/dL (ref 65–99)
POTASSIUM: 4 mmol/L (ref 3.5–5.1)
Sodium: 137 mmol/L (ref 135–145)

## 2015-11-18 NOTE — H&P (Signed)
Rebekah Jones is a 55 yo female,  G: 0  who presents for a hysterectomy because of symptomatic uterine fibroids and  irregular bleeding.  Since 2014 the patient has had irregular bleeding  that occurred daily unless she was taking Provera 30-40 mg daily.  Her bleeding would require a single panty liner daily or at times, several pads a day.  Additionally she experienced cramping that she rated 5/10 on a 10 point pain scale but analgesia was not required.  In February 2017 the patient stopped her Provera and was amenorrheic until April. Once again, however, she  began to experience daily spotting,  so resumed her Provera.  She denies any problems with bowel or bladder function and has not had any lower back pain.  A pelvic  ultrasound in June 2017 revealed:  uterus: 9.33 x 6.23 x 8.06 cm with #5 fibroids: subserosal-fundal 3.48 cm, 2.13 cm and 2.12 cm; intramural-calcified left 1.82 cm and calcified left inferior- 2.31 cm.;  Left ovary was not visualized and Right ovary appeared normal.  An MRI  in 2014  had shown a multi-fibroid uterus with the largest measuring 5.6 cm and an intra-cavitary fibroid- 5 cm with no adnexal masses  or other abnormalities identified.  The intra-cavitary fibroid was subsequently removed, hysteroscopically  in 2015 . Pathology from that procedure returned benign findings of fibroid and endometrial polyps with no atypia, hyperplasia or malignancy. A review of both medical and surgical management options were given to the patient however, given the protracted and disruptive nature of her symptoms she has decided  to proceed with definitive therapy in the form of  hysterectomy.   Past Medical History  OB History: G:0  GYN History: menarche:  55YO    LMP: see HPI    Contracepton abstinence  The patient denies history of sexually transmitted disease.  Denies history of abnormal PAP smear.   Last PAP smear: 01/18/12 normal PAP and negative HPV  Medical History: Anemia, Vitamin D  deficiency, Hypertension, Ovarian Cyst, Fibroids, Endometrial Polyp, Irritable Bowel Syndrome and Migraines.  Surgical History:  1995  Right Breast Biopsy-benign; 2015 Hysteroscopy with Resection of Fibroid and Polyp Denies problems with anesthesia or history of blood transfusions  Family History: Diabetes Mellitus, Breast Cancer,  Hypertension and Alzheimers  Social History: Single and employed as a Pharmacist, hospital;  Denies tobacco and alcohol intake  Medication  Triamterine/HCTZ 37.5/25 mg  daily Provera 30-40 mg daily prn  Allergies  Allergen Reactions  . Eggs Or Egg-Derived Products   . Ibuprofen     Due to IBS  . Penicillins Hives  . Sulfa Antibiotics Hives   Per patient she is able to take Vicodin  ROS: Admits to glasses, right wrist pain and occasional constipation due to irritable bowel syndrome;   Denies headache, vision changes, nasal congestion, dysphagia, tinnitus, dizziness, hoarseness, cough,  chest pain, shortness of breath, nausea, vomiting, diarrhea,  urinary frequency, urgency  dysuria, hematuria, vaginitis symptoms, pelvic pain, swelling of joints,easy bruising,  myalgias, arthralgias, skin rashes, unexplained weight loss and except as is mentioned in the history of present illness, patient's review of systems is otherwise negative.   Physical Exam  Bp: 96/56   P: 68 bpm   R: 16    Temperature: 98.9 degrees F orally    Weight: 108lbs    Height: 4' 10.25"   BMI: 22.4  Neck: supple without masses or thyromegaly Lungs: clear to auscultation Heart: regular rate and rhythm Abdomen: soft, non-tender and no organomegaly Pelvic:EGBUS-  wnl; vagina-normal rugae; uterus-irregular 10-12  weeks  size, cervix without lesions or motion tenderness; adnexae-no tenderness or masses Extremities:  no clubbing, cyanosis or edema   Assesment:  Symptomatic Uterine Fibroids             Irregular Bleeding   Disposition:  The patient was given the indications for her procedure along with  its risks,  to include, but not limited to: reaction to anesthesia, damage to adjacent organs, infection and excessive bleeding. The patient verbalized understanding of these risks and has consented to proceed with an Abdominal Hysterectomy with Bilateral Salpingectomy at [facility] on December 02, 2015 ay 8:45 a.m.  CSN# [id number]   [place] J. Florene Glen, PA-C  for Dr. Seymour Bars. Johnnette Laux

## 2015-11-18 NOTE — Patient Instructions (Addendum)
Your procedure is scheduled on:  Thursday, December 02, 2015  Enter through the Main Entrance of [facility] at:  7:15 AM  Pick up the phone at the desk and dial [phone].  Call this number if you have problems the morning of surgery: [phone].  Remember: Do NOT eat food or drink after:  Midnight Wednesday, December 01, 2015  Take these medicines the morning of surgery with a SIP OF WATER:  Triamterene  Do NOT wear jewelry (body piercing), metal hair clips/bobby pins, make-up, or nail polish. Do NOT wear lotions, powders, or perfumes.  You may wear deodorant. Do NOT shave for 48 hours prior to surgery. Do NOT bring valuables to the hospital. Contacts, dentures, or bridgework may not be worn into surgery.  Leave suitcase in car.  After surgery it may be brought to your room.  For patients admitted to the hospital, checkout time is 11:00 AM the day of discharge.

## 2015-12-01 MED ORDER — DEXTROSE 5 % IV SOLN
2.0000 g | INTRAVENOUS | Status: AC
  Administered 2015-12-02: 2 g via INTRAVENOUS
  Filled 2015-12-01: qty 2

## 2015-12-02 ENCOUNTER — Inpatient Hospital Stay (HOSPITAL_COMMUNITY): Payer: Managed Care, Other (non HMO) | Admitting: Certified Registered Nurse Anesthetist

## 2015-12-02 ENCOUNTER — Inpatient Hospital Stay (HOSPITAL_COMMUNITY)
Admission: RE | Admit: 2015-12-02 | Discharge: 2015-12-03 | DRG: 743 | Disposition: A | Discharge status: Home / Self Care | Payer: Managed Care, Other (non HMO) | Source: Ambulatory Visit | Attending: Obstetrics and Gynecology | Admitting: Obstetrics and Gynecology

## 2015-12-02 ENCOUNTER — Encounter (HOSPITAL_COMMUNITY): Payer: Self-pay

## 2015-12-02 ENCOUNTER — Encounter (HOSPITAL_COMMUNITY)
Admission: RE | Disposition: A | Discharge status: Home / Self Care | Payer: Self-pay | Source: Ambulatory Visit | Attending: Obstetrics and Gynecology

## 2015-12-02 DIAGNOSIS — N840 Polyp of corpus uteri: Secondary | ICD-10-CM | POA: Diagnosis present

## 2015-12-02 DIAGNOSIS — N839 Noninflammatory disorder of ovary, fallopian tube and broad ligament, unspecified: Secondary | ICD-10-CM | POA: Diagnosis present

## 2015-12-02 DIAGNOSIS — K589 Irritable bowel syndrome without diarrhea: Secondary | ICD-10-CM | POA: Diagnosis present

## 2015-12-02 DIAGNOSIS — E559 Vitamin D deficiency, unspecified: Secondary | ICD-10-CM | POA: Diagnosis present

## 2015-12-02 DIAGNOSIS — D649 Anemia, unspecified: Secondary | ICD-10-CM | POA: Diagnosis present

## 2015-12-02 DIAGNOSIS — I10 Essential (primary) hypertension: Secondary | ICD-10-CM | POA: Diagnosis present

## 2015-12-02 DIAGNOSIS — R319 Hematuria, unspecified: Secondary | ICD-10-CM | POA: Diagnosis not present

## 2015-12-02 DIAGNOSIS — D252 Subserosal leiomyoma of uterus: Secondary | ICD-10-CM | POA: Diagnosis present

## 2015-12-02 DIAGNOSIS — R102 Pelvic and perineal pain: Secondary | ICD-10-CM | POA: Diagnosis present

## 2015-12-02 DIAGNOSIS — N950 Postmenopausal bleeding: Secondary | ICD-10-CM | POA: Diagnosis present

## 2015-12-02 DIAGNOSIS — D259 Leiomyoma of uterus, unspecified: Secondary | ICD-10-CM | POA: Diagnosis present

## 2015-12-02 DIAGNOSIS — D251 Intramural leiomyoma of uterus: Secondary | ICD-10-CM | POA: Diagnosis present

## 2015-12-02 HISTORY — PX: HYSTERECTOMY ABDOMINAL WITH SALPINGECTOMY: SHX6725

## 2015-12-02 HISTORY — PX: CYSTOSCOPY: SHX5120

## 2015-12-02 LAB — TYPE AND SCREEN
ABO/RH(D): O POS
Antibody Screen: NEGATIVE

## 2015-12-02 LAB — ABO/RH: ABO/RH(D): O POS

## 2015-12-02 SURGERY — HYSTERECTOMY, TOTAL, ABDOMINAL, WITH SALPINGECTOMY
Anesthesia: General | Site: Bladder | Laterality: Bilateral

## 2015-12-02 MED ORDER — ROCURONIUM BROMIDE 100 MG/10ML IV SOLN
INTRAVENOUS | Status: DC | PRN
  Administered 2015-12-02: 10 mg via INTRAVENOUS
  Administered 2015-12-02: 35 mg via INTRAVENOUS

## 2015-12-02 MED ORDER — FENTANYL CITRATE (PF) 100 MCG/2ML IJ SOLN
INTRAMUSCULAR | Status: DC | PRN
  Administered 2015-12-02: 50 ug via INTRAVENOUS
  Administered 2015-12-02: 25 ug via INTRAVENOUS
  Administered 2015-12-02: 50 ug via INTRAVENOUS
  Administered 2015-12-02: 25 ug via INTRAVENOUS

## 2015-12-02 MED ORDER — GLYCOPYRROLATE 0.2 MG/ML IJ SOLN
INTRAMUSCULAR | Status: DC | PRN
  Administered 2015-12-02: 0.1 mg via INTRAVENOUS

## 2015-12-02 MED ORDER — HEPARIN SODIUM (PORCINE) 5000 UNIT/ML IJ SOLN
INTRAMUSCULAR | Status: AC
  Filled 2015-12-02: qty 1

## 2015-12-02 MED ORDER — KETOROLAC TROMETHAMINE 30 MG/ML IJ SOLN
30.0000 mg | Freq: Four times a day (QID) | INTRAMUSCULAR | Status: AC
  Administered 2015-12-02 – 2015-12-03 (×3): 30 mg via INTRAVENOUS
  Filled 2015-12-02 (×4): qty 1

## 2015-12-02 MED ORDER — DIPHENHYDRAMINE HCL 50 MG/ML IJ SOLN: 12.5000 mg | Freq: Four times a day (QID) | INTRAMUSCULAR | Status: DC | PRN

## 2015-12-02 MED ORDER — SCOPOLAMINE 1 MG/3DAYS TD PT72
MEDICATED_PATCH | TRANSDERMAL | Status: AC
  Filled 2015-12-02: qty 1

## 2015-12-02 MED ORDER — ONDANSETRON HCL 4 MG PO TABS: 4.0000 mg | ORAL_TABLET | Freq: Three times a day (TID) | ORAL | Status: DC | PRN

## 2015-12-02 MED ORDER — PHENYLEPHRINE 40 MCG/ML (10ML) SYRINGE FOR IV PUSH (FOR BLOOD PRESSURE SUPPORT)
PREFILLED_SYRINGE | INTRAVENOUS | Status: AC
  Filled 2015-12-02: qty 10

## 2015-12-02 MED ORDER — SCOPOLAMINE 1 MG/3DAYS TD PT72
1.0000 | MEDICATED_PATCH | Freq: Once | TRANSDERMAL | Status: DC
  Administered 2015-12-02: 1.5 mg via TRANSDERMAL

## 2015-12-02 MED ORDER — HYDROMORPHONE HCL 1 MG/ML IJ SOLN
INTRAMUSCULAR | Status: AC
  Filled 2015-12-02: qty 1

## 2015-12-02 MED ORDER — DOCUSATE SODIUM 100 MG PO CAPS: 100.0000 mg | ORAL_CAPSULE | Freq: Two times a day (BID) | ORAL | Status: DC | PRN

## 2015-12-02 MED ORDER — FERROUS SULFATE 325 (65 FE) MG PO TABS
325.0000 mg | ORAL_TABLET | Freq: Every day | ORAL | Status: DC
  Administered 2015-12-03: 325 mg via ORAL
  Filled 2015-12-02: qty 1

## 2015-12-02 MED ORDER — FLUORESCEIN SODIUM 10 % IV SOLN
INTRAVENOUS | Status: AC
  Filled 2015-12-02: qty 5

## 2015-12-02 MED ORDER — BUPIVACAINE HCL (PF) 0.25 % IJ SOLN
INTRAMUSCULAR | Status: DC | PRN
  Administered 2015-12-02: 20 mL
  Administered 2015-12-02: 10 mL

## 2015-12-02 MED ORDER — DEXAMETHASONE SODIUM PHOSPHATE 10 MG/ML IJ SOLN
INTRAMUSCULAR | Status: DC | PRN
  Administered 2015-12-02 (×2): 5 mg via INTRAVENOUS

## 2015-12-02 MED ORDER — SUGAMMADEX SODIUM 200 MG/2ML IV SOLN: INTRAVENOUS | Status: DC | PRN

## 2015-12-02 MED ORDER — PROPOFOL 10 MG/ML IV BOLUS
INTRAVENOUS | Status: DC | PRN
  Administered 2015-12-02: 125 mg via INTRAVENOUS

## 2015-12-02 MED ORDER — 0.9 % SODIUM CHLORIDE (POUR BTL) OPTIME
TOPICAL | Status: DC | PRN
  Administered 2015-12-02: 2000 mL

## 2015-12-02 MED ORDER — LIDOCAINE HCL (CARDIAC) 20 MG/ML IV SOLN
INTRAVENOUS | Status: AC
  Filled 2015-12-02: qty 5

## 2015-12-02 MED ORDER — FLUORESCEIN SODIUM 10 % IV SOLN
INTRAVENOUS | Status: DC | PRN
  Administered 2015-12-02: 1 mL via INTRAVENOUS

## 2015-12-02 MED ORDER — LIDOCAINE HCL (CARDIAC) 20 MG/ML IV SOLN
INTRAVENOUS | Status: DC | PRN
  Administered 2015-12-02: 50 mg via INTRAVENOUS

## 2015-12-02 MED ORDER — HYDROMORPHONE 1 MG/ML IV SOLN
INTRAVENOUS | Status: DC
  Administered 2015-12-02: 15:00:00 via INTRAVENOUS
  Filled 2015-12-02: qty 25

## 2015-12-02 MED ORDER — PROPOFOL 10 MG/ML IV BOLUS
INTRAVENOUS | Status: AC
  Filled 2015-12-02: qty 20

## 2015-12-02 MED ORDER — PHENYLEPHRINE HCL 10 MG/ML IJ SOLN
INTRAMUSCULAR | Status: DC | PRN
  Administered 2015-12-02: 80 ug via INTRAVENOUS

## 2015-12-02 MED ORDER — DEXAMETHASONE SODIUM PHOSPHATE 10 MG/ML IJ SOLN
INTRAMUSCULAR | Status: AC
  Filled 2015-12-02: qty 1

## 2015-12-02 MED ORDER — KETOROLAC TROMETHAMINE 30 MG/ML IJ SOLN
INTRAMUSCULAR | Status: DC | PRN
  Administered 2015-12-02: 30 mg via INTRAVENOUS

## 2015-12-02 MED ORDER — ONDANSETRON HCL 4 MG/2ML IJ SOLN
4.0000 mg | Freq: Four times a day (QID) | INTRAMUSCULAR | Status: DC | PRN
  Administered 2015-12-02: 4 mg via INTRAVENOUS
  Filled 2015-12-02: qty 2

## 2015-12-02 MED ORDER — FENTANYL CITRATE (PF) 250 MCG/5ML IJ SOLN
INTRAMUSCULAR | Status: AC
  Filled 2015-12-02: qty 5

## 2015-12-02 MED ORDER — FAMOTIDINE 20 MG PO TABS
20.0000 mg | ORAL_TABLET | Freq: Every day | ORAL | Status: DC
  Administered 2015-12-02 – 2015-12-03 (×2): 20 mg via ORAL
  Filled 2015-12-02 (×2): qty 1

## 2015-12-02 MED ORDER — MIDAZOLAM HCL 2 MG/2ML IJ SOLN
INTRAMUSCULAR | Status: AC
  Filled 2015-12-02: qty 2

## 2015-12-02 MED ORDER — LACTATED RINGERS IV SOLN
INTRAVENOUS | Status: DC
  Administered 2015-12-02: 09:00:00 via INTRAVENOUS
  Administered 2015-12-02: 125 mL/h via INTRAVENOUS
  Administered 2015-12-02 (×2): via INTRAVENOUS

## 2015-12-02 MED ORDER — LACTATED RINGERS IV SOLN
INTRAVENOUS | Status: DC
  Administered 2015-12-02: 20:00:00 via INTRAVENOUS

## 2015-12-02 MED ORDER — NALOXONE HCL 0.4 MG/ML IJ SOLN: 0.4000 mg | INTRAMUSCULAR | Status: DC | PRN

## 2015-12-02 MED ORDER — TRIAMTERENE-HCTZ 37.5-25 MG PO TABS
1.0000 | ORAL_TABLET | Freq: Every day | ORAL | Status: DC
  Administered 2015-12-03: 1 via ORAL
  Filled 2015-12-02 (×2): qty 1

## 2015-12-02 MED ORDER — SUGAMMADEX SODIUM 200 MG/2ML IV SOLN
INTRAVENOUS | Status: DC | PRN
  Administered 2015-12-02: 98.2 mg via INTRAVENOUS

## 2015-12-02 MED ORDER — ONDANSETRON HCL 4 MG/2ML IJ SOLN
INTRAMUSCULAR | Status: DC | PRN
  Administered 2015-12-02: 4 mg via INTRAVENOUS

## 2015-12-02 MED ORDER — NEOSTIGMINE METHYLSULFATE 10 MG/10ML IV SOLN
INTRAVENOUS | Status: AC
  Filled 2015-12-02: qty 1

## 2015-12-02 MED ORDER — ROCURONIUM BROMIDE 100 MG/10ML IV SOLN
INTRAVENOUS | Status: AC
  Filled 2015-12-02: qty 1

## 2015-12-02 MED ORDER — OXYCODONE-ACETAMINOPHEN 5-325 MG PO TABS
1.0000 | ORAL_TABLET | Freq: Four times a day (QID) | ORAL | Status: DC | PRN
  Administered 2015-12-03: 1 via ORAL
  Filled 2015-12-02 (×2): qty 1

## 2015-12-02 MED ORDER — MENTHOL 3 MG MT LOZG: 1.0000 | LOZENGE | OROMUCOSAL | Status: DC | PRN

## 2015-12-02 MED ORDER — ONDANSETRON HCL 4 MG/2ML IJ SOLN
INTRAMUSCULAR | Status: AC
  Filled 2015-12-02: qty 2

## 2015-12-02 MED ORDER — DIPHENHYDRAMINE HCL 12.5 MG/5ML PO ELIX: 12.5000 mg | ORAL_SOLUTION | Freq: Four times a day (QID) | ORAL | Status: DC | PRN

## 2015-12-02 MED ORDER — BUPIVACAINE HCL (PF) 0.25 % IJ SOLN
INTRAMUSCULAR | Status: AC
  Filled 2015-12-02: qty 30

## 2015-12-02 MED ORDER — MIDAZOLAM HCL 2 MG/2ML IJ SOLN
INTRAMUSCULAR | Status: DC | PRN
  Administered 2015-12-02: 2 mg via INTRAVENOUS

## 2015-12-02 MED ORDER — SODIUM CHLORIDE 0.9% FLUSH: 9.0000 mL | INTRAVENOUS | Status: DC | PRN

## 2015-12-02 MED ORDER — HYDROMORPHONE HCL 1 MG/ML IJ SOLN
INTRAMUSCULAR | Status: DC | PRN
  Administered 2015-12-02 (×2): 0.5 mg via INTRAVENOUS

## 2015-12-02 SURGICAL SUPPLY — 49 items
CANISTER SUCT 3000ML (MISCELLANEOUS) ×4 IMPLANT
CATH ROBINSON RED A/P 16FR (CATHETERS) IMPLANT
CLOTH BEACON ORANGE TIMEOUT ST (SAFETY) ×4 IMPLANT
CONT PATH 16OZ SNAP LID 3702 (MISCELLANEOUS) ×4 IMPLANT
DECANTER SPIKE VIAL GLASS SM (MISCELLANEOUS) ×4 IMPLANT
DRAIN JACKSON PRT FLT 7MM (DRAIN) IMPLANT
DRAPE WARM FLUID 44X44 (DRAPE) ×4 IMPLANT
DRSG OPSITE POSTOP 4X10 (GAUZE/BANDAGES/DRESSINGS) ×4 IMPLANT
DURAPREP 26ML APPLICATOR (WOUND CARE) ×4 IMPLANT
ELECT CAUTERY BLADE 6.4 (BLADE) IMPLANT
ELECT NEEDLE TIP 2.8 STRL (NEEDLE) IMPLANT
EVACUATOR SILICONE 100CC (DRAIN) IMPLANT
GAUZE SPONGE 4X4 16PLY XRAY LF (GAUZE/BANDAGES/DRESSINGS) IMPLANT
GLOVE BIOGEL PI IND STRL 7.0 (GLOVE) ×2 IMPLANT
GLOVE BIOGEL PI INDICATOR 7.0 (GLOVE) ×2
GLOVE SURG SS PI 6.5 STRL IVOR (GLOVE) ×8 IMPLANT
GOWN STRL REUS W/TWL LRG LVL3 (GOWN DISPOSABLE) ×12 IMPLANT
LIQUID BAND (GAUZE/BANDAGES/DRESSINGS) IMPLANT
NEEDLE HYPO 22GX1.5 SAFETY (NEEDLE) IMPLANT
NEEDLE SPNL 22GX3.5 QUINCKE BK (NEEDLE) ×4 IMPLANT
NS IRRIG 1000ML POUR BTL (IV SOLUTION) ×4 IMPLANT
PACK ABDOMINAL GYN (CUSTOM PROCEDURE TRAY) ×4 IMPLANT
PAD OB MATERNITY 4.3X12.25 (PERSONAL CARE ITEMS) ×4 IMPLANT
PENCIL SMOKE EVAC W/HOLSTER (ELECTROSURGICAL) ×4 IMPLANT
PROTECTOR NERVE ULNAR (MISCELLANEOUS) ×4 IMPLANT
RETRACTOR WND ALEXIS 25 LRG (MISCELLANEOUS) ×2 IMPLANT
RTRCTR WOUND ALEXIS 25CM LRG (MISCELLANEOUS) ×4
SPONGE LAP 18X18 X RAY DECT (DISPOSABLE) ×8 IMPLANT
STAPLER VISISTAT 35W (STAPLE) IMPLANT
SUT MNCRL AB 3-0 PS2 27 (SUTURE) ×4 IMPLANT
SUT PDS AB 1 CT  36 (SUTURE)
SUT PDS AB 1 CT 36 (SUTURE) IMPLANT
SUT PLAIN 2 0 XLH (SUTURE) IMPLANT
SUT SILK 0 FSL (SUTURE) IMPLANT
SUT VIC AB 0 CT1 18XCR BRD8 (SUTURE) ×6 IMPLANT
SUT VIC AB 0 CT1 27 (SUTURE) ×4
SUT VIC AB 0 CT1 27XBRD ANBCTR (SUTURE) ×2 IMPLANT
SUT VIC AB 0 CT1 27XCR 8 STRN (SUTURE) ×2 IMPLANT
SUT VIC AB 0 CT1 8-18 (SUTURE) ×6
SUT VIC AB 2-0 CT1 (SUTURE) ×4 IMPLANT
SUT VIC AB 3-0 SH 27 (SUTURE) ×2
SUT VIC AB 3-0 SH 27X BRD (SUTURE) ×2 IMPLANT
SUT VICRYL 0 TIES 12 18 (SUTURE) ×4 IMPLANT
SYR 50ML LL SCALE MARK (SYRINGE) IMPLANT
SYR CONTROL 10ML LL (SYRINGE) IMPLANT
SYR TB 1ML 25GX5/8 (SYRINGE) IMPLANT
TOWEL OR 17X24 6PK STRL BLUE (TOWEL DISPOSABLE) ×8 IMPLANT
TRAY FOLEY CATH SILVER 14FR (SET/KITS/TRAYS/PACK) ×4 IMPLANT
WATER STERILE IRR 1000ML POUR (IV SOLUTION) ×4 IMPLANT

## 2015-12-02 NOTE — Progress Notes (Signed)
Day of Surgery Procedure(s) (LRB): HYSTERECTOMY ABDOMINAL WITH SALPINGECTOMY with right ovarian biopsy (Bilateral) CYSTOSCOPY  Subjective: Patient reports  Nausea, no vomiting, no incisional pain, so has not used PCA at all.  Is tolerating PO.    Objective: I have reviewed patient's vital signs, intake and output and medications.  General: alert, cooperative and appears stated age Resp: clear to auscultation bilaterally Cardio: regular rate and rhythm, S1, S2 normal, no murmur, click, rub or gallop GI: soft, non-tender; bowel sounds normal; no masses,  no organomegaly.  Dressing dry Extremities: extremities normal, atraumatic, no cyanosis or edema Vaginal Bleeding: none  Assessment: s/p Procedure(s): HYSTERECTOMY ABDOMINAL WITH SALPINGECTOMY with right ovarian biopsy (Bilateral) CYSTOSCOPY: stable and tolerating diet with some nausea and hx of IBS  Plan: Advance diet and start antireflux medication Encourage ambulation Advance to PO medication Discharge home day 1 or 2  LOS: 0 days    Rebekah Jones P 12/02/2015, 7:37 PM

## 2015-12-02 NOTE — H&P (Signed)
History and Physical Interval Note:   12/02/2015   8:58 AM   Rebekah Jones  has presented today for surgery, with the diagnosis of Pelvic Pain, Post Menopausal Bleeding, Uterine Fibroids.  The various methods of treatment have been discussed with the patient and family. After consideration of risks, benefits and other options for treatment, the patient has consented to  Procedure(s): HYSTERECTOMY ABDOMINAL WITH SALPINGECTOMY as a surgical intervention .  I have reviewed the patients' chart and labs.  Questions were answered to the patient's satisfaction.     Eldred Manges  MD

## 2015-12-02 NOTE — Anesthesia Preprocedure Evaluation (Signed)
Anesthesia Evaluation  Patient identified by MRN, date of birth, ID band Patient awake    Reviewed: Allergy & Precautions, NPO status , Patient's Chart, lab work & pertinent test results  Airway Mallampati: II  TM Distance: >3 FB Neck ROM: Full    Dental no notable dental hx.    Pulmonary neg pulmonary ROS,    Pulmonary exam normal breath sounds clear to auscultation       Cardiovascular hypertension, Normal cardiovascular exam Rhythm:Regular Rate:Normal     Neuro/Psych negative neurological ROS  negative psych ROS   GI/Hepatic negative GI ROS, Neg liver ROS,   Endo/Other  negative endocrine ROS  Renal/GU negative Renal ROS  negative genitourinary   Musculoskeletal negative musculoskeletal ROS (+)   Abdominal   Peds negative pediatric ROS (+)  Hematology negative hematology ROS (+)   Anesthesia Other Findings   Reproductive/Obstetrics negative OB ROS                             Anesthesia Physical Anesthesia Plan  ASA: II  Anesthesia Plan: General   Post-op Pain Management:    Induction: Intravenous  Airway Management Planned: Oral ETT  Additional Equipment:   Intra-op Plan:   Post-operative Plan: Extubation in OR  Informed Consent: I have reviewed the patients History and Physical, chart, labs and discussed the procedure including the risks, benefits and alternatives for the proposed anesthesia with the patient or authorized representative who has indicated his/her understanding and acceptance.   Dental advisory given  Plan Discussed with: CRNA and Surgeon  Anesthesia Plan Comments:         Anesthesia Quick Evaluation  

## 2015-12-02 NOTE — Anesthesia Postprocedure Evaluation (Signed)
Anesthesia Post Note  Patient: Rebekah Jones  Procedure(s) Performed: Procedure(s) (LRB): HYSTERECTOMY ABDOMINAL WITH SALPINGECTOMY with right ovarian biopsy (Bilateral) CYSTOSCOPY  Patient location during evaluation: PACU Anesthesia Type: General Level of consciousness: awake and alert Pain management: pain level controlled Vital Signs Assessment: post-procedure vital signs reviewed and stable Respiratory status: spontaneous breathing, nonlabored ventilation, respiratory function stable and patient connected to nasal cannula oxygen Cardiovascular status: blood pressure returned to baseline and stable Postop Assessment: no signs of nausea or vomiting Anesthetic complications: no    Last Vitals:  Vitals:   12/02/15 0716 12/02/15 1242  BP: 117/66 (!) 110/53  Pulse: 67 80  Resp: 16   Temp: 36.7 C 36.8 C    Last Pain: There were no vitals filed for this visit.               Blakelyn Dinges S

## 2015-12-02 NOTE — Transfer of Care (Signed)
Immediate Anesthesia Transfer of Care Note  Patient: Rebekah Jones  Procedure(s) Performed: Procedure(s): HYSTERECTOMY ABDOMINAL WITH SALPINGECTOMY with right ovarian biopsy (Bilateral) CYSTOSCOPY  Patient Location: PACU  Anesthesia Type:General  Level of Consciousness: awake, alert  and oriented  Airway & Oxygen Therapy: Patient Spontanous Breathing and Patient connected to nasal cannula oxygen  Post-op Assessment: Report given to RN and Post -op Vital signs reviewed and stable  Post vital signs: Reviewed and stable  Last Vitals:  Vitals:   12/02/15 0716  BP: 117/66  Pulse: 67  Resp: 16  Temp: 36.7 C    Last Pain: There were no vitals filed for this visit.    Patients Stated Pain Goal: 3 ([id number])  Complications: No apparent anesthesia complications

## 2015-12-02 NOTE — Op Note (Addendum)
12/02/2015  12:48 PM  PATIENT:  Rebekah Jones  55 y.o. female MRN:  [id number]  PRE-OPERATIVE DIAGNOSIS:  Pelvic Pain, Post Menopausal Bleeding, Uterine Fibroids.  POST-OPERATIVE DIAGNOSIS:  pelvic pain, post menopausal bleeding, uterine fibroids.  Possible endometriosis  PROCEDURE:  Procedure(s): HYSTERECTOMY ABDOMINAL WITH BILATERAL SALPINGECTOMY with right ovarian biopsy CYSTOSCOPY  SURGEON:  Surgeon(s): Eldred Manges, MD  ASSISTANTS: Earnstine Regal certified physician assistant   ANESTHESIA:  General  ESTIMATED BLOOD LOSS: [id number] cc  COMPLICATIONS: Hematuria which prompted cystoscopy showing no bladder injury  BLOOD ADMINISTERED:none  DRAINS: Urinary Catheter (Foley)   LOCAL MEDICATIONS USED:  MARCAINE    and Amount: 30 ml  SPECIMEN:  Source of Specimen:  Uterine fundus sent as a frozen section.  Uterine cervix and bilateral fallopian tubes. Right ovarian biopsy, probable endometriosis  DISPOSITION OF SPECIMEN: pathology  FINDINGS:  The uterus was enlarged to 10 weeks size with multiple myomata, subserosal and pedunculated. The tubes appeared normal bilaterally.  The ovaries were small with some filmy adhesions to the posterior uterus.  The right ovary contained a small powderburn-type lesion which was excised.  The uterine fundus was opened to reveal a fluffy endometrium, which on frozen section show benign endometrial polyps.   COUNTS:  YES  PROCEDURE: The patient was taken to the operating room after appropriate identification and placed on the operating table in the supine position. Equipment for the induction of general anesthesia was placed.After the attainment of adequate general anesthesia, a timeout was performed. The abdomen, perineum, and vagina were prepped with multiple layers of Betadine.a Foley catheter was inserted into the bladder under sterile conditions and connected to straight drainage. The abdomen was draped as a sterile field.  Suprapubic  injection of 10 cc of quarter percent Marcaine was undertaken. A suprapubic incision was made and the abdomen opened in layers. Peritoneum was entered and a self-retaining retractor placed in the abdominal cavity. A bladder blade was placed. After visual and palpable intraperitoneal evaluation of the anatomy was carried out large Kelly clamps were placed at the cornual regions of the uterus. The left round ligament was then identified clamped cut and suture ligated. That incision was taken anteriorly on the anterior leaf of the broad ligament. The utero-ovarian ligament was identified clamped cut, free tied and suture-ligated. A similar procedure was carried out on the opposite side with the round ligament and utero-ovarian ligament. The bladder flap was completed on the opposite side with incision of the anterior leaf of the broad ligament. The bladder was dissected off the anterior cervix with a combination of blunt and sharp dissection. The uterine arteries on the right and left side were clamped cut and suture ligated. The uterine fundus was then excised from the cervix and removed from the operative field. The uterine fundus was opened to reveal the endometrial cavity with several areas of apparent hypertrophic tissue. This was thus sent for frozen section for further evaluation. The frozen section diagnosis was benign endometrial polyps.  Para-cervical tissues were then clamped cut and suture ligated. Uterosacral ligaments were clamped cut suture-ligated and those sutures held. The vaginal angles were clamped cut suture ligated and the sutures held. The remainder of the vagina was incised and the cervix were removed from the operative field. Vaginal cuff was closed with figure-of-eight sutures of 0 Vicryl. [place] Sutures to further reduce the posterior cul-de-sac were then placed from one uterosacral ligament to the other and timed down. Copious irrigation was carried out. The sutures holding the vaginal  angles and uterosacral ligaments were then tied together. Hemostasis was noted to be adequate and all instruments were removed from the peritoneal cavity.  The abdominal peritoneum was closed using running suture of 2-0 Vicryl.  The rectus fascia was closed with running sutures of 0 Vicryl from each apex to  the midline and tied in the midline. The subcutaneous tissue was made hemostatic with Bovie cautery and irrigated. The skin incision was closed with a subcuticular suture of 3-0 Monocryl. Sterile dressing was applied. The decision was made to proceed with cystoscopy. Since the patient had begun having hematuria. After removal of the cervix. The patient was then placed in the lithotomy position and the perineum draped. The Foley catheter was removed. The urethra was prepped with Betadine and the 70 cystoscope inserted, allowing visualization of the bladder to be intact without any areas of visible injury and fluoroscein colored urine to egress from each ureteral orifice. The cystoscope was removed and the Foley catheter replaced.  The patient was awakened from general anesthesia and taken to the recovery room in satisfactory condition having tolerated the procedure well with  sponge and instrument counts correct.  PLAN OF CARE: Admit to the women's unit after postanesthesia care  PATIENT DISPOSITION:  PACU - hemodynamically stable.   Delay start of Pharmacological VTE agent (>24hrs) due to surgical blood loss or risk of bleeding:  Yes. SCD hose were used throughout the procedure.    Zackory Pudlo P 12:48 PM

## 2015-12-02 NOTE — Anesthesia Procedure Notes (Signed)
Procedure Name: Intubation Date/Time: 12/02/2015 9:18 AM Performed by: Flossie Dibble Pre-anesthesia Checklist: Patient identified, Emergency Drugs available, Suction available and Patient being monitored Patient Re-evaluated:Patient Re-evaluated prior to inductionOxygen Delivery Method: Circle system utilized Preoxygenation: Pre-oxygenation with 100% oxygen Intubation Type: IV induction and Inhalational induction Ventilation: Mask ventilation without difficulty Laryngoscope Size: Audino and 3 Grade View: Grade I Tube type: Oral Tube size: 7.0 mm Number of attempts: 1 Airway Equipment and Method: Stylet Placement Confirmation: ETT inserted through vocal cords under direct vision,  positive ETCO2,  CO2 detector and breath sounds checked- equal and bilateral Secured at: 20 cm Tube secured with: Tape Dental Injury: Teeth and Oropharynx as per pre-operative assessment

## 2015-12-02 NOTE — Progress Notes (Addendum)
Pt admitted to unit. Report received by PACU nurse. Pt feeling nausea from the gurney ride over from [place]. Too early to receive Zofran. Alcohol pad placed by pt's nares. States its helping. PCA pump set up and verified. Instructions provided to patient w understanding. VSS. See flow sheet for details.  1800: c/o nausea again. Medicated per order. See MAR.   [id number]: pt declines toradol. Thinks might upset stomach due to IBS.

## 2015-12-03 LAB — CBC
HCT: 31.2 % — ABNORMAL LOW (ref 36.0–46.0)
HEMOGLOBIN: 10.5 g/dL — AB (ref 12.0–15.0)
MCH: 28.6 pg (ref 26.0–34.0)
MCHC: 33.7 g/dL (ref 30.0–36.0)
MCV: 85 fL (ref 78.0–100.0)
PLATELETS: 147 10*3/uL — AB (ref 150–400)
RBC: 3.67 MIL/uL — AB (ref 3.87–5.11)
RDW: 12.6 % (ref 11.5–15.5)
WBC: 11.4 10*3/uL — ABNORMAL HIGH (ref 4.0–10.5)

## 2015-12-03 MED ORDER — OXYCODONE-ACETAMINOPHEN 5-325 MG PO TABS: 1.0000 | ORAL_TABLET | Freq: Four times a day (QID) | ORAL | 0 refills | Status: AC | PRN

## 2015-12-03 MED ORDER — ONDANSETRON HCL 4 MG PO TABS: 4.0000 mg | ORAL_TABLET | Freq: Three times a day (TID) | ORAL | 0 refills | Status: AC | PRN

## 2015-12-03 NOTE — Discharge Summary (Signed)
Physician Discharge Summary  Patient ID: Rebekah Jones MRN: [id number] DOB/AGE: 05-25-1960 55 y.o.  Admit date: 12/02/2015 Discharge date: 12/03/2015   Discharge Diagnoses:  Postmenopausal Bleeding and Uterine Fibroids Active Problems:   Post-menopausal bleeding   Operation: Total Abdominal Hysterectomy, Bilateral Salpingectomy and Cystoscopy  Discharged Condition: Good  Hospital Course: On the date of admission the patient underwent the aforementioned procedures and tolerated them well.  Post operative course was unremarkable with the patient resuming bowel and bladder function by post operative day #1 and was therefore deemed ready for discharge home.  Discharge hemoglobin was 10.5.   Disposition: 01-Home or Self Care  Discharge Medications:    Medication List    STOP taking these medications   acetaminophen 500 MG tablet Commonly known as:  TYLENOL   medroxyPROGESTERone 10 MG tablet Commonly known as:  PROVERA     TAKE these medications   ferrous sulfate 325 (65 FE) MG tablet Take 325 mg by mouth daily with breakfast.   loratadine 10 MG tablet Commonly known as:  CLARITIN Take 10 mg by mouth daily.   ondansetron 4 MG tablet Commonly known as:  ZOFRAN Take 1 tablet (4 mg total) by mouth every 8 (eight) hours as needed for nausea or vomiting.   oxyCODONE-acetaminophen 5-325 MG tablet Commonly known as:  PERCOCET/ROXICET Take 1 tablet by mouth every 6 (six) hours as needed for severe pain (when tolerating p o).   triamterene-hydrochlorothiazide 37.5-25 MG tablet Commonly known as:  MAXZIDE-25 Take 1 tablet by mouth daily.          Follow-up: Dr. Lorriane Shire P. Haygood on January 06, 2016 at 10 a.m.   SignedEarnstine Regal, PA-C 12/03/2015, 7:51 AM

## 2015-12-03 NOTE — Progress Notes (Signed)
Pt. Discharged home with sister. Discharge instructions given and reviewed. Prescriptions given and reviewed. No questions at this time.

## 2015-12-03 NOTE — Discharge Instructions (Signed)
Call [facility] @ [phone] if:  You have a temperature greater than or equal to 100.4 degrees Farenheit orally You have pain that is not made better by the pain medication given and taken as directed You have excessive bleeding or problems urinating  Take Colace (Docusate Sodium/Stool Softener) 100 mg 2-3 times daily while taking narcotic pain medicine to avoid constipation or until bowel movements are regular. Take over the counter iron supplement of your choice,  twice a day for the next 90 days  You may drive after 2  weeks You may walk up steps  You may shower  You may resume a regular diet  Keep incisions clean and dry; remove honeycomb dressing on December 08, 2015 Do not lift over 15 pounds for 6 weeks Avoid anything in vagina for 6 weeks (or until after your post-operative visit)

## 2015-12-03 NOTE — Progress Notes (Cosign Needed)
Rebekah Jones is a21 y.o.  [id number]  Post Op Date # 1: TAH/BS/Cystocopy  Subjective: Patient is Doing well postoperatively. Patient has Pain is controlled with current analgesics. Medications being used: prescription NSAID's including Ketorolac 30 mg IV. Denies nausea, ambulating in the halls tolerating po-currently eating a regular breakfast and  has voided.    Objective: Vital signs in last 24 hours: Temp:  [98.1 F (36.7 C)-98.7 F (37.1 C)] 98.7 F (37.1 C) (09/01 0508) Pulse Rate:  [80-94] 93 (09/01 0508) Resp:  [14-20] 16 (09/01 0508) BP: (109-122)/(47-67) 121/59 (09/01 0508) SpO2:  [98 %-100 %] 100 % (09/01 0508) FiO2 (%):  [14 %] 14 % (08/31 1825)  Intake/Output from previous day: 08/31 0701 - 09/01 0700 In: 5020.4 [P.O.:660; I.V.:4360.4] Out: 2400 [Urine:2175] Intake/Output this shift: No intake/output data recorded.  Recent Labs Lab 12/03/15 0600  WBC 11.4*  HGB 10.5*  HCT 31.2*  PLT 147*    No results for input(s): NA, K, CL, CO2, BUN, CREATININE, CALCIUM, PROT, BILITOT, ALKPHOS, ALT, AST, GLUCOSE in the last 168 hours.  Invalid input(s): LABALBU  EXAM: General: alert, cooperative and no distress Resp: clear to auscultation bilaterally Cardio: regular rate and rhythm, S1, S2 normal, no murmur, click, rub or gallop GI: Bowel sounds present;  dressing is clean/dry/intact. Extremities: Homans sign is negative, no sign of DVT and no calf tenderness.   Assessment: s/p Procedure(s): HYSTERECTOMY ABDOMINAL WITH SALPINGECTOMY with right ovarian biopsy CYSTOSCOPY: stable, progressing well and anemia  Plan: Routine care, will consider discharge later today.  LOS: 1 day    [name], PA-C 12/03/2015 7:40 AM

## 2015-12-03 NOTE — Addendum Note (Signed)
Addendum  created 12/03/15 [id number] by Jonna Munro, CRNA   Sign clinical note

## 2015-12-03 NOTE — Anesthesia Postprocedure Evaluation (Signed)
Anesthesia Post Note  Patient: Rebekah Jones  Procedure(s) Performed: Procedure(s) (LRB): HYSTERECTOMY ABDOMINAL WITH SALPINGECTOMY with right ovarian biopsy (Bilateral) CYSTOSCOPY  Patient location during evaluation: Women's Unit Anesthesia Type: General Level of consciousness: awake and alert and oriented Pain management: pain level controlled Vital Signs Assessment: post-procedure vital signs reviewed and stable Respiratory status: spontaneous breathing, nonlabored ventilation and respiratory function stable Cardiovascular status: stable Postop Assessment: no signs of nausea or vomiting and adequate PO intake Anesthetic complications: no     Last Vitals:  Vitals:   12/03/15 0107 12/03/15 0508  BP: 118/67 (!) 121/59  Pulse: 92 93  Resp: 17 16  Temp: 36.9 C 37.1 C    Last Pain:  Vitals:   12/03/15 0840  TempSrc:   PainSc: 1    Pain Goal: Patients Stated Pain Goal: 1 (12/03/15 0840)               Willa Rough

## 2015-12-05 ENCOUNTER — Encounter (HOSPITAL_COMMUNITY): Payer: Self-pay | Admitting: Obstetrics and Gynecology

## 2015-12-22 ENCOUNTER — Other Ambulatory Visit: Payer: Self-pay | Admitting: Obstetrics and Gynecology

## 2015-12-22 DIAGNOSIS — Z1231 Encounter for screening mammogram for malignant neoplasm of breast: Secondary | ICD-10-CM

## 2016-01-12 ENCOUNTER — Ambulatory Visit
Admission: RE | Admit: 2016-01-12 | Discharge: 2016-01-12 | Disposition: A | Discharge status: Home / Self Care | Payer: Managed Care, Other (non HMO) | Source: Ambulatory Visit | Attending: Obstetrics and Gynecology | Admitting: Obstetrics and Gynecology

## 2016-01-12 ENCOUNTER — Encounter: Payer: Self-pay | Admitting: Radiology

## 2016-01-12 DIAGNOSIS — Z1231 Encounter for screening mammogram for malignant neoplasm of breast: Secondary | ICD-10-CM

## 2016-12-08 ENCOUNTER — Other Ambulatory Visit: Payer: Self-pay | Admitting: Obstetrics and Gynecology

## 2016-12-08 DIAGNOSIS — Z1231 Encounter for screening mammogram for malignant neoplasm of breast: Secondary | ICD-10-CM

## 2017-01-16 ENCOUNTER — Ambulatory Visit
Admission: RE | Admit: 2017-01-16 | Discharge: 2017-01-16 | Disposition: A | Discharge status: Home / Self Care | Payer: Managed Care, Other (non HMO) | Source: Ambulatory Visit | Attending: Obstetrics and Gynecology | Admitting: Obstetrics and Gynecology

## 2017-01-16 DIAGNOSIS — Z1231 Encounter for screening mammogram for malignant neoplasm of breast: Secondary | ICD-10-CM

## 2017-10-09 ENCOUNTER — Other Ambulatory Visit: Payer: Self-pay | Admitting: Obstetrics and Gynecology

## 2017-10-09 DIAGNOSIS — N644 Mastodynia: Secondary | ICD-10-CM

## 2017-10-12 ENCOUNTER — Ambulatory Visit
Admission: RE | Admit: 2017-10-12 | Discharge: 2017-10-12 | Disposition: A | Discharge status: Home / Self Care | Payer: 59 | Source: Ambulatory Visit | Attending: Obstetrics and Gynecology | Admitting: Obstetrics and Gynecology

## 2017-10-12 ENCOUNTER — Ambulatory Visit
Admission: RE | Admit: 2017-10-12 | Discharge: 2017-10-12 | Disposition: A | Discharge status: Home / Self Care | Payer: Managed Care, Other (non HMO) | Source: Ambulatory Visit | Attending: Obstetrics and Gynecology | Admitting: Obstetrics and Gynecology

## 2017-10-12 DIAGNOSIS — N644 Mastodynia: Secondary | ICD-10-CM

## 2018-01-07 ENCOUNTER — Other Ambulatory Visit: Payer: Self-pay | Admitting: Obstetrics and Gynecology

## 2018-01-07 DIAGNOSIS — Z1231 Encounter for screening mammogram for malignant neoplasm of breast: Secondary | ICD-10-CM

## 2018-02-08 ENCOUNTER — Ambulatory Visit
Admission: RE | Admit: 2018-02-08 | Discharge: 2018-02-08 | Disposition: A | Discharge status: Home / Self Care | Payer: 59 | Source: Ambulatory Visit | Attending: Obstetrics and Gynecology | Admitting: Obstetrics and Gynecology

## 2018-02-08 DIAGNOSIS — Z1231 Encounter for screening mammogram for malignant neoplasm of breast: Secondary | ICD-10-CM

## 2019-01-13 ENCOUNTER — Other Ambulatory Visit: Payer: Self-pay | Admitting: Family Medicine

## 2019-01-13 DIAGNOSIS — Z1231 Encounter for screening mammogram for malignant neoplasm of breast: Secondary | ICD-10-CM

## 2019-02-26 ENCOUNTER — Other Ambulatory Visit: Payer: Self-pay

## 2019-02-26 ENCOUNTER — Ambulatory Visit
Admission: RE | Admit: 2019-02-26 | Discharge: 2019-02-26 | Disposition: A | Discharge status: Home / Self Care | Payer: 59 | Source: Ambulatory Visit | Attending: Family Medicine | Admitting: Family Medicine

## 2019-02-26 DIAGNOSIS — Z1231 Encounter for screening mammogram for malignant neoplasm of breast: Secondary | ICD-10-CM

## 2019-12-05 ENCOUNTER — Other Ambulatory Visit: Payer: Self-pay | Admitting: Family Medicine

## 2019-12-05 DIAGNOSIS — E2839 Other primary ovarian failure: Secondary | ICD-10-CM

## 2019-12-15 ENCOUNTER — Other Ambulatory Visit: Payer: Self-pay | Admitting: Family Medicine

## 2019-12-15 DIAGNOSIS — Z1231 Encounter for screening mammogram for malignant neoplasm of breast: Secondary | ICD-10-CM

## 2020-03-29 ENCOUNTER — Ambulatory Visit
Admission: RE | Admit: 2020-03-29 | Discharge: 2020-03-29 | Disposition: A | Discharge status: Home / Self Care | Payer: 59 | Source: Ambulatory Visit | Attending: Family Medicine | Admitting: Family Medicine

## 2020-03-29 ENCOUNTER — Other Ambulatory Visit: Payer: Self-pay

## 2020-03-29 DIAGNOSIS — E2839 Other primary ovarian failure: Secondary | ICD-10-CM

## 2020-03-29 DIAGNOSIS — Z1231 Encounter for screening mammogram for malignant neoplasm of breast: Secondary | ICD-10-CM

## 2021-02-23 ENCOUNTER — Other Ambulatory Visit: Payer: Self-pay | Admitting: Family Medicine

## 2021-02-23 DIAGNOSIS — Z1231 Encounter for screening mammogram for malignant neoplasm of breast: Secondary | ICD-10-CM

## 2021-03-31 ENCOUNTER — Ambulatory Visit
Admission: RE | Admit: 2021-03-31 | Discharge: 2021-03-31 | Disposition: A | Discharge status: Home / Self Care | Payer: 59 | Source: Ambulatory Visit | Attending: Family Medicine | Admitting: Family Medicine

## 2021-03-31 DIAGNOSIS — Z1231 Encounter for screening mammogram for malignant neoplasm of breast: Secondary | ICD-10-CM

## 2021-05-02 NOTE — Progress Notes (Deleted)
NEW PATIENT Date of Service/Encounter:  05/02/21 Referring provider: {Blank single:19197::"Griffin, Margaretha Sheffield, MD","none-self referred"} Primary care provider: Kelton Pillar, MD  Subjective:  Rebekah Jones is a 61 y.o. female with a PMHx of fibroids, anemia, lactose intolerance, vitamin D deficiency, IBS presenting today for evaluation of "allergies". History obtained from: chart review and {Persons; PED relatives w/patient:19415::"patient"}.   ***  History of reaction to medications:  - penicillins *** - sulfas *** Other allergy screening: Asthma: {Blank single:19197::"yes","no"} Rhino conjunctivitis: {Blank single:19197::"yes","no"} Food allergy: {Blank single:19197::"yes","no"} Medication allergy: {Blank single:19197::"yes","no"} Hymenoptera allergy: {Blank single:19197::"yes","no"} Urticaria: {Blank single:19197::"yes","no"} Eczema:{Blank single:19197::"yes","no"} History of recurrent infections suggestive of immunodeficency: {Blank single:19197::"yes","no"} ***Vaccinations are up to date.   Past Medical History: Past Medical History:  Diagnosis Date   Anemia    Arthritis    Dysmenorrhea    Endocervical polyp    Fibroids    Headache    Migraines   Hypertension    IBS (irritable bowel syndrome)    Ovarian cyst    Vitamin D deficiency    Medication List:  Current Outpatient Medications  Medication Sig Dispense Refill   ferrous sulfate 325 (65 FE) MG tablet Take 325 mg by mouth daily with breakfast.     loratadine (CLARITIN) 10 MG tablet Take 10 mg by mouth daily.     ondansetron (ZOFRAN) 4 MG tablet Take 1 tablet (4 mg total) by mouth every 8 (eight) hours as needed for nausea or vomiting. 20 tablet 0   oxyCODONE-acetaminophen (PERCOCET/ROXICET) 5-325 MG tablet Take 1 tablet by mouth every 6 (six) hours as needed for severe pain (when tolerating p o). 30 tablet 0   triamterene-hydrochlorothiazide (MAXZIDE-25) 37.5-25 MG per tablet Take 1 tablet by mouth daily.      No current facility-administered medications for this visit.   Known Allergies:  Allergies  Allergen Reactions   Eggs Or Egg-Derived Products    Ibuprofen     Due to IBS   Penicillins Hives    Has patient had a PCN reaction causing immediate rash, facial/tongue/throat swelling, SOB or lightheadedness with hypotension: unknown, childhood Has patient had a PCN reaction causing severe rash involving mucus membranes or skin necrosis:  Has patient had a PCN reaction that required hospitalization No Has patient had a PCN reaction occurring within the last 10 years: No If all of the above answers are "NO", then may proceed with Cephalosporin use.    Sulfa Antibiotics Hives   Past Surgical History: Past Surgical History:  Procedure Laterality Date   BREAST BIOPSY     BREAST EXCISIONAL BIOPSY Right    COLONOSCOPY     CYSTOSCOPY  12/02/2015   Procedure: CYSTOSCOPY;  Surgeon: Eldred Manges, MD;  Location: [facility];  Service: Gynecology;;   DILATATION & CURETTAGE/HYSTEROSCOPY WITH TRUECLEAR N/A 04/11/2013   Procedure: DILATATION & CURETTAGE/HYSTEROSCOPY WITH POSSIBLE TRUCLEAR;  Surgeon: Eldred Manges, MD;  Location: [facility];  Service: Gynecology;  Laterality: N/A;   HYSTERECTOMY ABDOMINAL WITH SALPINGECTOMY Bilateral 12/02/2015   Procedure: HYSTERECTOMY ABDOMINAL WITH SALPINGECTOMY with right ovarian biopsy;  Surgeon: Eldred Manges, MD;  Location: [facility];  Service: Gynecology;  Laterality: Bilateral;   Family History: Family History  Problem Relation Age of Onset   Diabetes Maternal Grandmother    Cancer Maternal Grandmother    Breast cancer Maternal Grandmother 7   Hypertension Mother    Diabetes Father    Hypertension Father    Alzheimer's disease Father    Hypertension Sister    Alzheimer's disease Paternal  Aunt    Cancer Paternal Aunt    Social History: Rebekah Jones lives ***.   ROS:  All other systems negative except as noted per HPI.  Objective:  Last menstrual  period 11/04/2015. There is no height or weight on file to calculate BMI. Physical Exam:  General Appearance:  Alert, cooperative, no distress, appears stated age  Head:  Normocephalic, without obvious abnormality, atraumatic  Eyes:  Conjunctiva clear, EOM's intact  Nose: Nares normal, {Blank multiple:19196:a:"***","hypertrophic turbinates","normal mucosa","no visible anterior polyps","septum midline"}  Throat: Lips, tongue normal; teeth and gums normal, {Blank multiple:19196:a:"***","normal posterior oropharynx","tonsils 2+","tonsils 3+","no tonsillar exudate","+ cobblestoning"}  Neck: Supple, symmetrical  Lungs:   {Blank multiple:19196:a:"***","clear to auscultation bilaterally","end-expiratory wheezing","wheezing throughout"}, Respirations unlabored, {Blank multiple:19196:a:"***","no coughing","intermittent dry coughing"}  Heart:  {Blank multiple:19196:a:"***","regular rate and rhythm","no murmur"}, Appears well perfused  Extremities: No edema  Skin: Skin color, texture, turgor normal, no rashes or lesions on visualized portions of skin  Neurologic: No gross deficits     Diagnostics: Spirometry:  Tracings reviewed. Her effort: {Blank single:19197::"Good reproducible efforts.","It was hard to get consistent efforts and there is a question as to whether this reflects a maximal maneuver.","Poor effort, data can not be interpreted.","Variable effort-results affected.","decent for first attempt at spirometry."} FVC: ***L (pre), ***L  (post) FEV1: ***L, ***% predicted (pre), ***L, ***% predicted (post) FEV1/FVC ratio: ***% (pre), ***% (post) Interpretation: {Blank single:19197::"Spirometry consistent with mild obstructive disease","Spirometry consistent with moderate obstructive disease","Spirometry consistent with severe obstructive disease","Spirometry consistent with possible restrictive disease","Spirometry consistent with mixed obstructive and restrictive disease","Spirometry  uninterpretable due to technique","Spirometry consistent with normal pattern","No overt abnormalities noted given today's efforts"} with *** bronchodilator response  Skin Testing: {Blank single:19197::"Select foods","Environmental allergy panel","Environmental allergy panel and select foods","Food allergy panel","None","Deferred due to recent antihistamines use"}. *** Adequate controls. Results discussed with patient/family.   {Blank single:19197::"Allergy testing results were read and interpreted by myself, documented by clinical staff."," "}  Assessment and Plan  There are no Patient Instructions on file for this visit.  {Blank single:19197::"This note in its entirety was forwarded to the Provider who requested this consultation."}  Thank you for your kind referral. I appreciate the opportunity to take part in Sabre's care. Please do not hesitate to contact me with questions.***  Sincerely,  Sigurd Sos, MD [facility]

## 2021-05-04 ENCOUNTER — Ambulatory Visit: Payer: Self-pay | Admitting: Internal Medicine

## 2021-06-21 DIAGNOSIS — Z6823 Body mass index (BMI) 23.0-23.9, adult: Secondary | ICD-10-CM | POA: Diagnosis not present

## 2021-06-21 DIAGNOSIS — Z01419 Encounter for gynecological examination (general) (routine) without abnormal findings: Secondary | ICD-10-CM | POA: Diagnosis not present

## 2021-12-12 DIAGNOSIS — E2839 Other primary ovarian failure: Secondary | ICD-10-CM | POA: Diagnosis not present

## 2021-12-12 DIAGNOSIS — L500 Allergic urticaria: Secondary | ICD-10-CM | POA: Diagnosis not present

## 2021-12-12 DIAGNOSIS — F4320 Adjustment disorder, unspecified: Secondary | ICD-10-CM | POA: Diagnosis not present

## 2021-12-12 DIAGNOSIS — D259 Leiomyoma of uterus, unspecified: Secondary | ICD-10-CM | POA: Diagnosis not present

## 2021-12-12 DIAGNOSIS — M8580 Other specified disorders of bone density and structure, unspecified site: Secondary | ICD-10-CM | POA: Diagnosis not present

## 2021-12-12 DIAGNOSIS — Z0000 Encounter for general adult medical examination without abnormal findings: Secondary | ICD-10-CM | POA: Diagnosis not present

## 2021-12-12 DIAGNOSIS — J309 Allergic rhinitis, unspecified: Secondary | ICD-10-CM | POA: Diagnosis not present

## 2021-12-12 DIAGNOSIS — I10 Essential (primary) hypertension: Secondary | ICD-10-CM | POA: Diagnosis not present

## 2021-12-12 DIAGNOSIS — Z13220 Encounter for screening for lipoid disorders: Secondary | ICD-10-CM | POA: Diagnosis not present

## 2022-01-30 DIAGNOSIS — R748 Abnormal levels of other serum enzymes: Secondary | ICD-10-CM | POA: Diagnosis not present

## 2022-03-07 ENCOUNTER — Other Ambulatory Visit: Payer: Self-pay | Admitting: Family Medicine

## 2022-03-07 DIAGNOSIS — Z1231 Encounter for screening mammogram for malignant neoplasm of breast: Secondary | ICD-10-CM

## 2022-05-05 ENCOUNTER — Ambulatory Visit
Admission: RE | Admit: 2022-05-05 | Discharge: 2022-05-05 | Disposition: A | Discharge status: Home / Self Care | Payer: 59 | Source: Ambulatory Visit | Attending: Family Medicine | Admitting: Family Medicine

## 2022-05-05 ENCOUNTER — Other Ambulatory Visit: Payer: Self-pay | Admitting: Internal Medicine

## 2022-05-05 DIAGNOSIS — Z1231 Encounter for screening mammogram for malignant neoplasm of breast: Secondary | ICD-10-CM | POA: Diagnosis not present

## 2022-08-08 DIAGNOSIS — K589 Irritable bowel syndrome without diarrhea: Secondary | ICD-10-CM | POA: Diagnosis not present

## 2022-08-08 DIAGNOSIS — Z780 Asymptomatic menopausal state: Secondary | ICD-10-CM | POA: Diagnosis not present

## 2022-08-08 DIAGNOSIS — Z90711 Acquired absence of uterus with remaining cervical stump: Secondary | ICD-10-CM | POA: Diagnosis not present

## 2022-08-08 DIAGNOSIS — Z01419 Encounter for gynecological examination (general) (routine) without abnormal findings: Secondary | ICD-10-CM | POA: Diagnosis not present

## 2022-08-30 IMAGING — MG MM DIGITAL SCREENING BILAT W/ TOMO AND CAD
6 of 12 series · 6 of 36 positions shown · non-contrast
Comparison: Previous exam(s).

CLINICAL DATA: Screening.

EXAM:
DIGITAL SCREENING BILATERAL MAMMOGRAM WITH TOMOSYNTHESIS AND CAD
TECHNIQUE: Bilateral screening digital craniocaudal and mediolateral oblique
mammograms were obtained. Bilateral screening digital breast
tomosynthesis was performed. The images were evaluated with
computer-aided detection.

[R MLO synth-2D (1 of 2)]
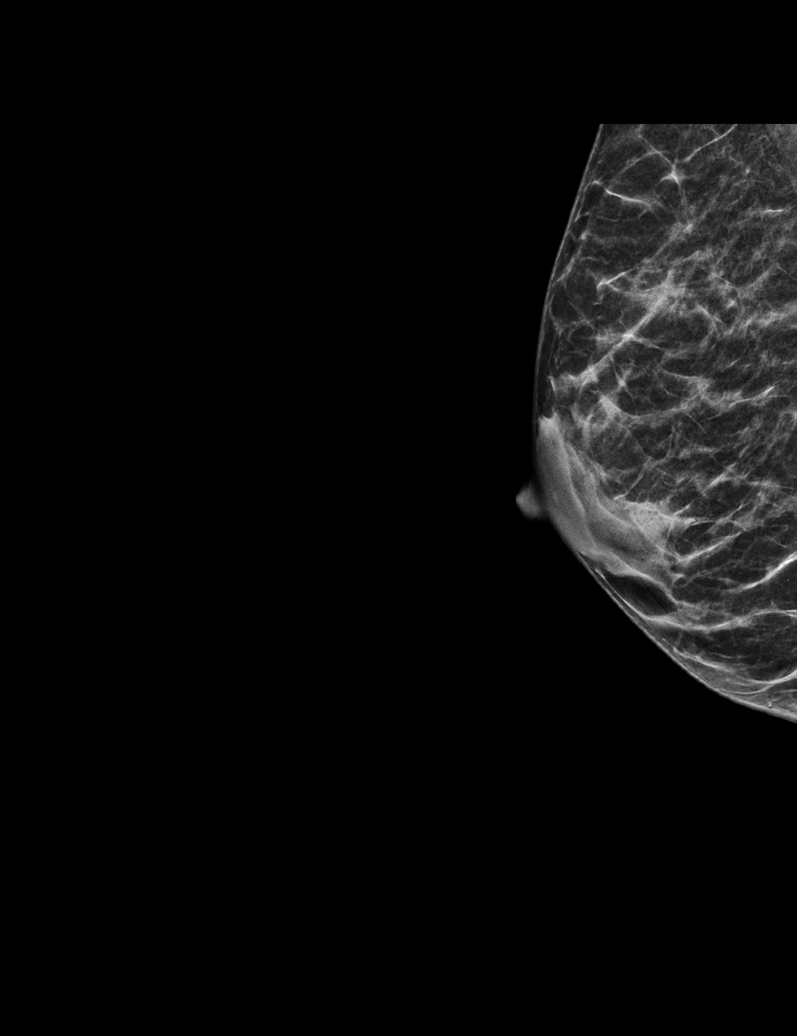

[R MLO synth-2D (2 of 2)]
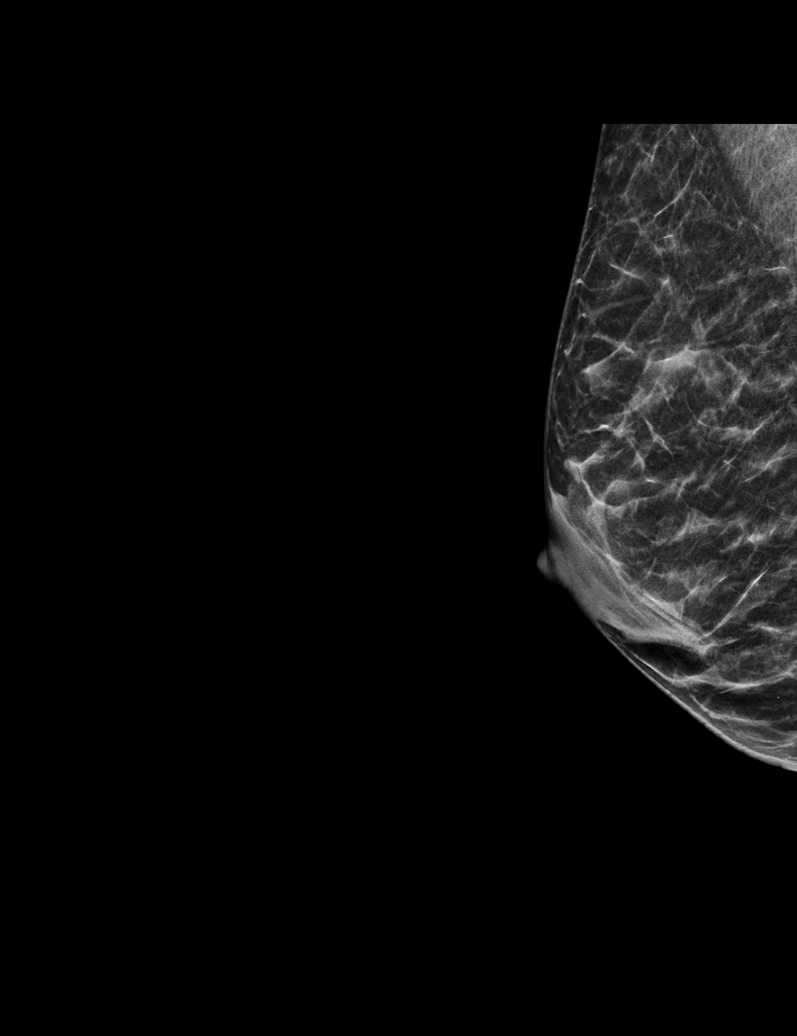

[L CC synth-2D]
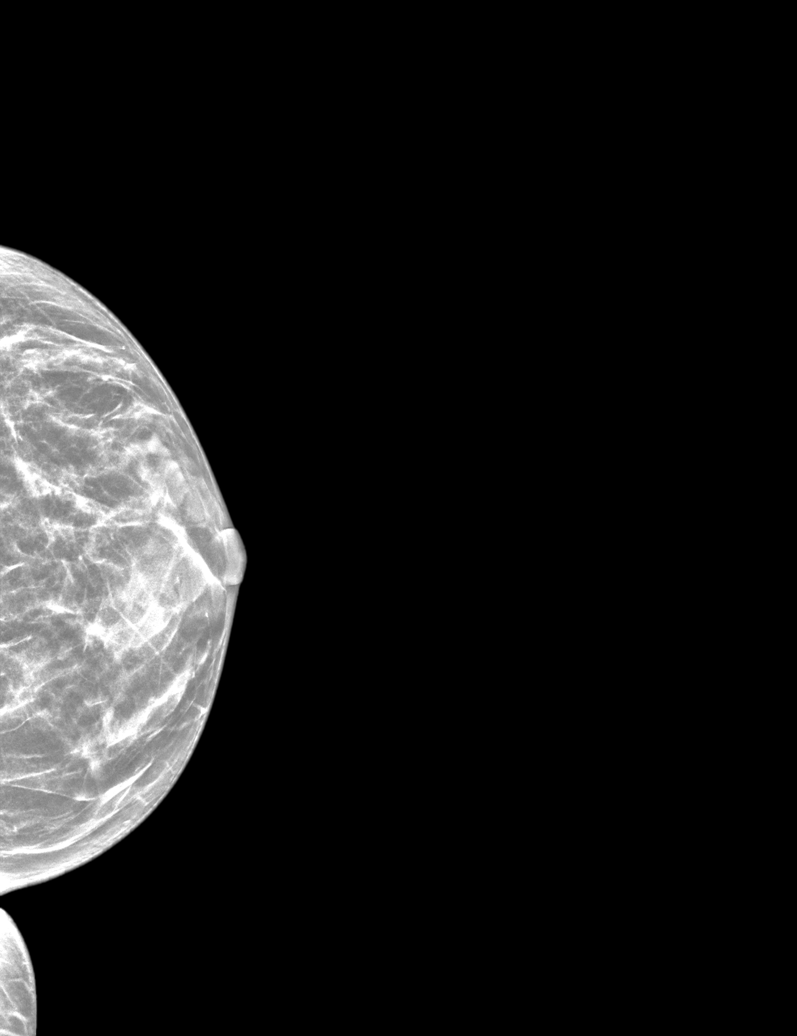

[L MLO synth-2D]
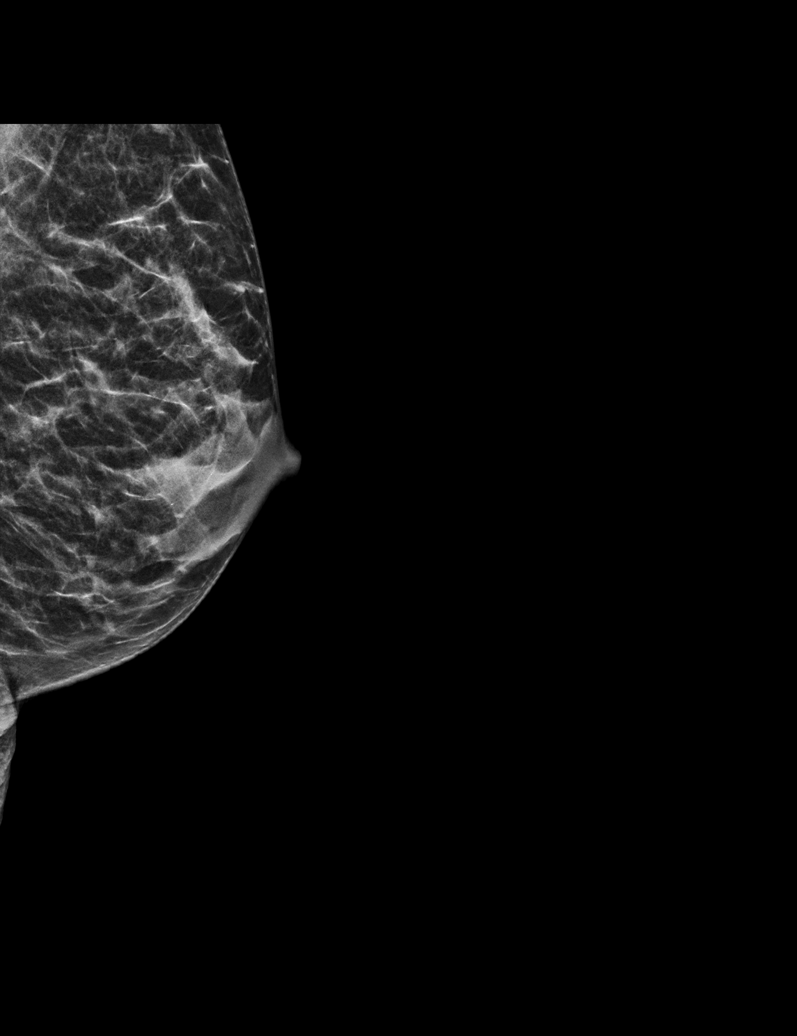

[R CC synth-2D]
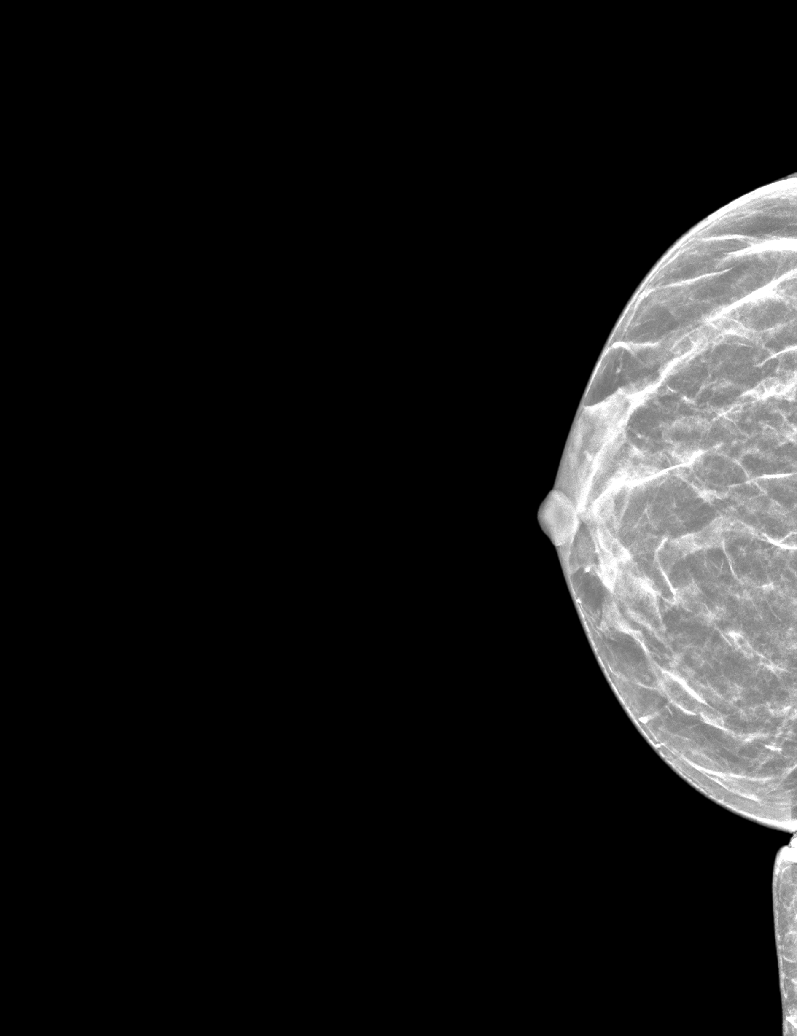

[L XCCL synth-2D]
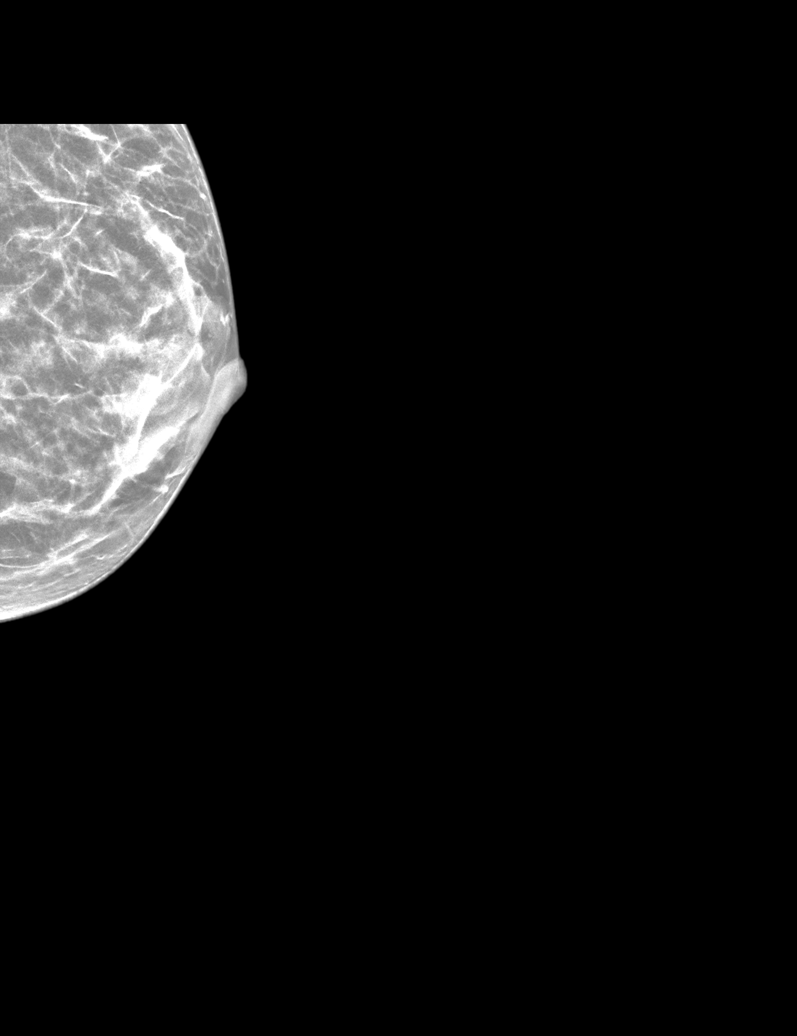

[6 of 36 positions shown; findings below may reference images not displayed]

ACR Breast Density Category b: There are scattered areas of
fibroglandular density.
FINDINGS: There are no findings suspicious for malignancy.
IMPRESSION: No mammographic evidence of malignancy. A result letter of this
screening mammogram will be mailed directly to the patient.

RECOMMENDATION:
Screening mammogram in one year. (Code:51-O-LD2)

BI-RADS CATEGORY  1: Negative.

## 2022-09-05 DIAGNOSIS — Z86010 Personal history of colonic polyps: Secondary | ICD-10-CM | POA: Diagnosis not present

## 2022-09-05 DIAGNOSIS — I10 Essential (primary) hypertension: Secondary | ICD-10-CM | POA: Diagnosis not present

## 2022-09-05 DIAGNOSIS — K581 Irritable bowel syndrome with constipation: Secondary | ICD-10-CM | POA: Diagnosis not present

## 2022-09-05 DIAGNOSIS — Z83719 Family history of colon polyps, unspecified: Secondary | ICD-10-CM | POA: Diagnosis not present

## 2023-02-15 DIAGNOSIS — H524 Presbyopia: Secondary | ICD-10-CM | POA: Diagnosis not present

## 2023-02-15 DIAGNOSIS — Z135 Encounter for screening for eye and ear disorders: Secondary | ICD-10-CM | POA: Diagnosis not present

## 2023-02-15 DIAGNOSIS — H52223 Regular astigmatism, bilateral: Secondary | ICD-10-CM | POA: Diagnosis not present

## 2023-02-15 DIAGNOSIS — H2513 Age-related nuclear cataract, bilateral: Secondary | ICD-10-CM | POA: Diagnosis not present

## 2023-02-15 DIAGNOSIS — H5203 Hypermetropia, bilateral: Secondary | ICD-10-CM | POA: Diagnosis not present

## 2023-04-06 ENCOUNTER — Other Ambulatory Visit: Payer: Self-pay | Admitting: Internal Medicine

## 2023-04-06 DIAGNOSIS — N6321 Unspecified lump in the left breast, upper outer quadrant: Secondary | ICD-10-CM

## 2023-04-06 DIAGNOSIS — M8588 Other specified disorders of bone density and structure, other site: Secondary | ICD-10-CM | POA: Diagnosis not present

## 2023-04-06 DIAGNOSIS — M8589 Other specified disorders of bone density and structure, multiple sites: Secondary | ICD-10-CM

## 2023-04-06 DIAGNOSIS — K219 Gastro-esophageal reflux disease without esophagitis: Secondary | ICD-10-CM | POA: Diagnosis not present

## 2023-04-06 DIAGNOSIS — G43009 Migraine without aura, not intractable, without status migrainosus: Secondary | ICD-10-CM | POA: Diagnosis not present

## 2023-04-06 DIAGNOSIS — J309 Allergic rhinitis, unspecified: Secondary | ICD-10-CM | POA: Diagnosis not present

## 2023-04-06 DIAGNOSIS — D259 Leiomyoma of uterus, unspecified: Secondary | ICD-10-CM | POA: Diagnosis not present

## 2023-04-06 DIAGNOSIS — I10 Essential (primary) hypertension: Secondary | ICD-10-CM | POA: Diagnosis not present

## 2023-04-06 DIAGNOSIS — Z0000 Encounter for general adult medical examination without abnormal findings: Secondary | ICD-10-CM | POA: Diagnosis not present

## 2023-04-06 DIAGNOSIS — L500 Allergic urticaria: Secondary | ICD-10-CM | POA: Diagnosis not present

## 2023-04-06 DIAGNOSIS — E2839 Other primary ovarian failure: Secondary | ICD-10-CM | POA: Diagnosis not present

## 2023-04-19 ENCOUNTER — Other Ambulatory Visit: Payer: 59

## 2023-05-10 ENCOUNTER — Ambulatory Visit
Admission: RE | Admit: 2023-05-10 | Discharge: 2023-05-10 | Disposition: A | Discharge status: Home / Self Care | Payer: 59 | Source: Ambulatory Visit | Attending: Internal Medicine | Admitting: Internal Medicine

## 2023-05-10 ENCOUNTER — Other Ambulatory Visit: Payer: 59

## 2023-05-10 DIAGNOSIS — E2839 Other primary ovarian failure: Secondary | ICD-10-CM | POA: Diagnosis not present

## 2023-05-10 DIAGNOSIS — M8588 Other specified disorders of bone density and structure, other site: Secondary | ICD-10-CM | POA: Diagnosis not present

## 2023-05-10 DIAGNOSIS — N6321 Unspecified lump in the left breast, upper outer quadrant: Secondary | ICD-10-CM | POA: Diagnosis not present

## 2023-05-10 DIAGNOSIS — N958 Other specified menopausal and perimenopausal disorders: Secondary | ICD-10-CM | POA: Diagnosis not present

## 2023-05-10 DIAGNOSIS — M8589 Other specified disorders of bone density and structure, multiple sites: Secondary | ICD-10-CM

## 2023-06-21 DIAGNOSIS — M810 Age-related osteoporosis without current pathological fracture: Secondary | ICD-10-CM | POA: Diagnosis not present

## 2023-07-30 DIAGNOSIS — Z91048 Other nonmedicinal substance allergy status: Secondary | ICD-10-CM | POA: Diagnosis not present

## 2023-08-28 DIAGNOSIS — H109 Unspecified conjunctivitis: Secondary | ICD-10-CM | POA: Diagnosis not present

## 2023-08-28 DIAGNOSIS — H01009 Unspecified blepharitis unspecified eye, unspecified eyelid: Secondary | ICD-10-CM | POA: Diagnosis not present

## 2023-08-28 DIAGNOSIS — M8580 Other specified disorders of bone density and structure, unspecified site: Secondary | ICD-10-CM | POA: Diagnosis not present

## 2023-10-04 DIAGNOSIS — E2839 Other primary ovarian failure: Secondary | ICD-10-CM | POA: Diagnosis not present

## 2023-10-04 DIAGNOSIS — I10 Essential (primary) hypertension: Secondary | ICD-10-CM | POA: Diagnosis not present

## 2023-10-04 DIAGNOSIS — K219 Gastro-esophageal reflux disease without esophagitis: Secondary | ICD-10-CM | POA: Diagnosis not present

## 2023-10-04 DIAGNOSIS — D259 Leiomyoma of uterus, unspecified: Secondary | ICD-10-CM | POA: Diagnosis not present

## 2023-10-04 DIAGNOSIS — M810 Age-related osteoporosis without current pathological fracture: Secondary | ICD-10-CM | POA: Diagnosis not present

## 2023-10-04 DIAGNOSIS — G43009 Migraine without aura, not intractable, without status migrainosus: Secondary | ICD-10-CM | POA: Diagnosis not present

## 2023-10-04 DIAGNOSIS — L500 Allergic urticaria: Secondary | ICD-10-CM | POA: Diagnosis not present

## 2023-10-04 DIAGNOSIS — J309 Allergic rhinitis, unspecified: Secondary | ICD-10-CM | POA: Diagnosis not present

## 2023-10-10 DIAGNOSIS — H401132 Primary open-angle glaucoma, bilateral, moderate stage: Secondary | ICD-10-CM | POA: Diagnosis not present

## 2023-10-10 DIAGNOSIS — H5203 Hypermetropia, bilateral: Secondary | ICD-10-CM | POA: Diagnosis not present

## 2023-10-10 DIAGNOSIS — H25813 Combined forms of age-related cataract, bilateral: Secondary | ICD-10-CM | POA: Diagnosis not present

## 2023-10-19 DIAGNOSIS — H40053 Ocular hypertension, bilateral: Secondary | ICD-10-CM | POA: Diagnosis not present

## 2023-10-19 DIAGNOSIS — M810 Age-related osteoporosis without current pathological fracture: Secondary | ICD-10-CM | POA: Diagnosis not present

## 2023-12-05 DIAGNOSIS — H401131 Primary open-angle glaucoma, bilateral, mild stage: Secondary | ICD-10-CM | POA: Diagnosis not present

## 2023-12-18 DIAGNOSIS — Z83511 Family history of glaucoma: Secondary | ICD-10-CM | POA: Diagnosis not present

## 2023-12-18 DIAGNOSIS — Z8262 Family history of osteoporosis: Secondary | ICD-10-CM | POA: Diagnosis not present

## 2023-12-18 DIAGNOSIS — M810 Age-related osteoporosis without current pathological fracture: Secondary | ICD-10-CM | POA: Diagnosis not present

## 2023-12-18 DIAGNOSIS — Z841 Family history of disorders of kidney and ureter: Secondary | ICD-10-CM | POA: Diagnosis not present

## 2023-12-18 DIAGNOSIS — Z87898 Personal history of other specified conditions: Secondary | ICD-10-CM | POA: Diagnosis not present

## 2023-12-18 DIAGNOSIS — Z8349 Family history of other endocrine, nutritional and metabolic diseases: Secondary | ICD-10-CM | POA: Diagnosis not present

## 2023-12-18 DIAGNOSIS — H0520 Unspecified exophthalmos: Secondary | ICD-10-CM | POA: Diagnosis not present

## 2023-12-18 DIAGNOSIS — R634 Abnormal weight loss: Secondary | ICD-10-CM | POA: Diagnosis not present

## 2023-12-24 DIAGNOSIS — M810 Age-related osteoporosis without current pathological fracture: Secondary | ICD-10-CM | POA: Diagnosis not present

## 2024-01-01 DIAGNOSIS — M810 Age-related osteoporosis without current pathological fracture: Secondary | ICD-10-CM | POA: Diagnosis not present

## 2024-01-01 DIAGNOSIS — Z8262 Family history of osteoporosis: Secondary | ICD-10-CM | POA: Diagnosis not present

## 2024-02-13 DIAGNOSIS — M5117 Intervertebral disc disorders with radiculopathy, lumbosacral region: Secondary | ICD-10-CM | POA: Diagnosis not present

## 2024-02-13 DIAGNOSIS — M62830 Muscle spasm of back: Secondary | ICD-10-CM | POA: Diagnosis not present

## 2024-02-13 DIAGNOSIS — M5002 Cervical disc disorder with myelopathy, mid-cervical region, unspecified level: Secondary | ICD-10-CM | POA: Diagnosis not present

## 2024-02-13 DIAGNOSIS — G43009 Migraine without aura, not intractable, without status migrainosus: Secondary | ICD-10-CM | POA: Diagnosis not present

## 2024-02-13 DIAGNOSIS — M7912 Myalgia of auxiliary muscles, head and neck: Secondary | ICD-10-CM | POA: Diagnosis not present

## 2024-03-05 DIAGNOSIS — M5002 Cervical disc disorder with myelopathy, mid-cervical region, unspecified level: Secondary | ICD-10-CM | POA: Diagnosis not present

## 2024-03-05 DIAGNOSIS — G43009 Migraine without aura, not intractable, without status migrainosus: Secondary | ICD-10-CM | POA: Diagnosis not present

## 2024-03-05 DIAGNOSIS — M5117 Intervertebral disc disorders with radiculopathy, lumbosacral region: Secondary | ICD-10-CM | POA: Diagnosis not present

## 2024-03-05 DIAGNOSIS — M7912 Myalgia of auxiliary muscles, head and neck: Secondary | ICD-10-CM | POA: Diagnosis not present

## 2024-03-05 DIAGNOSIS — M62830 Muscle spasm of back: Secondary | ICD-10-CM | POA: Diagnosis not present
